# Patient Record
Sex: Female | Born: 1990 | Race: Black or African American | Hispanic: No | Marital: Single | Smoking: Never smoker
Health system: Southern US, Community
[De-identification: ages and names within clinical notes are randomized; demographics above are authoritative.]

## PROBLEM LIST (undated history)

## (undated) HISTORY — PX: WISDOM TOOTH EXTRACTION: SHX21

---

## 2012-05-10 ENCOUNTER — Encounter (HOSPITAL_COMMUNITY): Payer: Self-pay | Admitting: *Deleted

## 2012-05-10 ENCOUNTER — Other Ambulatory Visit (HOSPITAL_COMMUNITY)
Admission: RE | Admit: 2012-05-10 | Discharge: 2012-05-10 | Disposition: A | Discharge status: Home / Self Care | Payer: Self-pay | Source: Ambulatory Visit | Attending: Emergency Medicine | Admitting: Emergency Medicine

## 2012-05-10 ENCOUNTER — Emergency Department (INDEPENDENT_AMBULATORY_CARE_PROVIDER_SITE_OTHER)
Admission: EM | Admit: 2012-05-10 | Discharge: 2012-05-10 | Disposition: A | Discharge status: Home / Self Care | Payer: Self-pay | Source: Home / Self Care | Attending: Emergency Medicine | Admitting: Emergency Medicine

## 2012-05-10 DIAGNOSIS — N760 Acute vaginitis: Secondary | ICD-10-CM | POA: Insufficient documentation

## 2012-05-10 DIAGNOSIS — N949 Unspecified condition associated with female genital organs and menstrual cycle: Secondary | ICD-10-CM

## 2012-05-10 DIAGNOSIS — N938 Other specified abnormal uterine and vaginal bleeding: Secondary | ICD-10-CM

## 2012-05-10 LAB — POCT URINALYSIS DIP (DEVICE)
Bilirubin Urine: NEGATIVE
Ketones, ur: NEGATIVE mg/dL
Specific Gravity, Urine: 1.02 (ref 1.005–1.030)
pH: 7 (ref 5.0–8.0)

## 2012-05-10 NOTE — ED Notes (Signed)
Pt reports abnormal vaginal bleeding

## 2012-05-10 NOTE — ED Notes (Signed)
Lab order review  

## 2012-05-10 NOTE — ED Provider Notes (Signed)
Chief Complaint  Patient presents with  . Vaginal Bleeding    History of Present Illness:   The patient is a 21 year old female who's had a one-week history of light vaginal spotting. She has mild cramping rated a 1/10 in intensity. She denies any passage of clots or tissue. Last menstrual period was October 24 and lasted for 5 days. This was her normal menses. She is sexually active and usually uses condoms, however in the past month on a couple occasions she has not use them. She denies any fever, chills, nausea, vomiting, or urinary symptoms. She has had no prior history of GYN problems or STDs.  Review of Systems:  Other than noted above, the patient denies any of the following symptoms: Systemic:  No fever, chills, sweats, fatigue, or weight loss. GI:  No abdominal pain, nausea, anorexia, vomiting, diarrhea, constipation, melena or hematochezia. GU:  No dysuria, frequency, urgency, hematuria, vaginal discharge, itching, or abnormal vaginal bleeding. Skin:  No rash or itching.   PMFSH:  Past medical history, family history, social history, meds, and allergies were reviewed.  Physical Exam:   Vital signs:  BP 137/80  Pulse 88  Temp 99.4 F (37.4 C) (Oral)  Resp 18  SpO2 100% General:  Alert, oriented and in no distress. Lungs:  Breath sounds clear and equal bilaterally.  No wheezes, rales or rhonchi. Heart:  Regular rhythm.  No gallops or murmers. Abdomen:  Soft, flat and non-distended.  No organomegaly or mass.  No tenderness, guarding or rebound.  Bowel sounds normally active. Pelvic exam:  Normal external genitalia. Vaginal and cervical mucosa were normal. No cervical motion tenderness. There was no blood in the vaginal vault or coming from the cervical os. Uterus was midposition normal in size and shape and nontender. No adnexal masses or tenderness. Skin:  Clear, warm and dry.  Labs:   Results for orders placed during the hospital encounter of 05/10/12  POCT URINALYSIS DIP  (DEVICE)      Component Value Range   Glucose, UA NEGATIVE  NEGATIVE mg/dL   Bilirubin Urine NEGATIVE  NEGATIVE   Ketones, ur NEGATIVE  NEGATIVE mg/dL   Specific Gravity, Urine 1.020  1.005 - 1.030   Hgb urine dipstick TRACE (*) NEGATIVE   pH 7.0  5.0 - 8.0   Protein, ur 30 (*) NEGATIVE mg/dL   Urobilinogen, UA 1.0  0.0 - 1.0 mg/dL   Nitrite NEGATIVE  NEGATIVE   Leukocytes, UA TRACE (*) NEGATIVE  POCT PREGNANCY, URINE      Component Value Range   Preg Test, Ur NEGATIVE  NEGATIVE   Other Labs Obtained at Urgent Care Center:  GC and Chlamydia DNA probe was obtained as well as DNA probe for Trichomonas, Candida, and bacterial vaginosis.  Results are pending at this time and we will call about any positive results.  Assessment:  The encounter diagnosis was Dysfunctional uterine bleeding.  Plan:   1.  The following meds were prescribed:   New Prescriptions   No medications on file   2.  The patient was instructed in symptomatic care and handouts were given. 3.  The patient was told to return if becoming worse in any way, if no better in 3 or 4 days, and given some red flag symptoms that would indicate earlier return.    Reuben Likes, MD 05/10/12 [phone]

## 2012-05-14 ENCOUNTER — Telehealth (HOSPITAL_COMMUNITY): Payer: Self-pay | Admitting: Emergency Medicine

## 2012-05-14 MED ORDER — METRONIDAZOLE 500 MG PO TABS: 500.0000 mg | ORAL_TABLET | Freq: Two times a day (BID) | ORAL | Status: DC

## 2012-05-14 NOTE — ED Notes (Signed)
Her Gardnerella test came back positive, so we'll go ahead and treat with metronidazole 500 mg twice a day for a week.  Reuben Likes, MD 05/14/12 [phone]

## 2012-05-14 NOTE — Telephone Encounter (Signed)
Message copied by Reuben Likes on Sun May 14, 2012 11:51 AM ------      Message from: Vassie Moselle      Created: Fri May 12, 2012 11:10 PM       Gardnerella pos.  Do I need to do anything?  Vassie Moselle      05/12/2012

## 2012-05-15 ENCOUNTER — Telehealth (HOSPITAL_COMMUNITY): Payer: Self-pay | Admitting: *Deleted

## 2012-05-15 NOTE — ED Notes (Signed)
GC/Chlamydia neg., Wet prep: pos. for Gardnerella.  Dr. Lorenz Coaster ordered Flagyl 500 mg. BID x 7 days. I called pt.  Pt. verified x 2 and given results.  Pt. told she needs Flagyl for bacterial vaginosis.  Pt. instructed to no alcohol while taking this medication.  Pt. wants Rx. called to [facility] on [address]. Rx. called to pharmacist @ [phone].

## 2013-01-11 ENCOUNTER — Emergency Department (HOSPITAL_COMMUNITY)
Admission: EM | Admit: 2013-01-11 | Discharge: 2013-01-11 | Disposition: A | Discharge status: Home / Self Care | Payer: Self-pay | Attending: Emergency Medicine | Admitting: Emergency Medicine

## 2013-01-11 ENCOUNTER — Encounter (HOSPITAL_COMMUNITY): Payer: Self-pay | Admitting: Family Medicine

## 2013-01-11 ENCOUNTER — Emergency Department (HOSPITAL_COMMUNITY): Payer: Self-pay

## 2013-01-11 DIAGNOSIS — R0789 Other chest pain: Secondary | ICD-10-CM

## 2013-01-11 DIAGNOSIS — R071 Chest pain on breathing: Secondary | ICD-10-CM | POA: Insufficient documentation

## 2013-01-11 LAB — CBC
Hemoglobin: 10.8 g/dL — ABNORMAL LOW (ref 12.0–15.0)
MCH: 24.5 pg — ABNORMAL LOW (ref 26.0–34.0)
RBC: 4.4 MIL/uL (ref 3.87–5.11)

## 2013-01-11 LAB — BASIC METABOLIC PANEL
CO2: 27 mEq/L (ref 19–32)
Calcium: 9.5 mg/dL (ref 8.4–10.5)
Glucose, Bld: 102 mg/dL — ABNORMAL HIGH (ref 70–99)
Sodium: 139 mEq/L (ref 135–145)

## 2013-01-11 LAB — POCT I-STAT TROPONIN I

## 2013-01-11 MED ORDER — IBUPROFEN 600 MG PO TABS: 600.0000 mg | ORAL_TABLET | Freq: Three times a day (TID) | ORAL | Status: DC | PRN

## 2013-01-11 MED ORDER — GI COCKTAIL ~~LOC~~
30.0000 mL | Freq: Once | ORAL | Status: AC
  Administered 2013-01-11: 30 mL via ORAL
  Filled 2013-01-11: qty 30

## 2013-01-11 NOTE — ED Notes (Signed)
Per pt sts intermittent chest pain since Tuesday. sts it feels like heartburn. sts mid sternal and non-radiating. sts a deep breath makes her heart flutter.

## 2013-01-11 NOTE — ED Provider Notes (Signed)
I saw and evaluated the patient, reviewed the resident's note and I agree with the findings and plan. The patient presents with complaints of chest discomfort that started several days ago after bending over to pick up an object she had dropped.  She denies any shortness of breath, diaphoresis, or radiation to the arm or jaw.  She denies recent exertional symptoms.    On exam, the vitals are stable and the patient is afebrile.  The heart is regular rate and rhythm and the lungs are clear.  The abdomen is soft, non-tender.  The extremities are without edema.    The ekg is unremarkable (I agree with the resident's interpretation) and the troponin is negative.  This seems musculoskeletal in nature.  Will discharge with nsaids, return prn if she worsens.   Geoffery Lyons, MD 01/11/13 [phone]

## 2013-01-11 NOTE — ED Provider Notes (Signed)
CSN: [id number]     Arrival date & time 01/11/13  1546 History     First MD Initiated Contact with Patient 01/11/13 1826     Chief Complaint  Patient presents with  . Chest Pain   (Consider location/radiation/quality/duration/timing/severity/associated sxs/prior Treatment) HPI Comments: Pt w/ no PMHx now w/ chest pain. States moved awkwardly on Monday while at work (dropped radio and bent sideways to pick up), noted gradual onset substernal pressure after that incident. Pressure is constant, not exacerbated or relieved by anything. Not a/w dyspnea, nausea or diaphoresis. Not pleuritic or exertional. No recent fever or cough. No hx of DVT/PE, no tobacco or drugs, no hx of HTN, HLD or DM. + family hx of CAD.  Patient is a 22 y.o. female presenting with chest pain. The history is provided by the patient. No language interpreter was used.  Chest Pain Pain location:  Substernal area Pain quality: aching and pressure   Pain radiates to:  Does not radiate Pain radiates to the back: no   Pain severity:  Mild Onset quality:  Gradual Duration:  4 days Timing:  Constant Progression:  Unchanged Chronicity:  New Context: not breathing, not lifting, no movement, not at rest, no stress and no trauma   Relieved by:  Nothing Worsened by:  Nothing tried Ineffective treatments:  None tried Associated symptoms: no abdominal pain, no cough, no dizziness, no fever, no headache, no nausea, no shortness of breath and not vomiting     History reviewed. No pertinent past medical history. History reviewed. No pertinent past surgical history. History reviewed. No pertinent family history. History  Substance Use Topics  . Smoking status: Never Smoker   . Smokeless tobacco: Not on file  . Alcohol Use: No   OB History   Grav Para Term Preterm Abortions TAB SAB Ect Mult Living                 Review of Systems  Constitutional: Negative for fever and chills.  HENT: Negative for congestion and sore  throat.   Respiratory: Negative for cough and shortness of breath.   Cardiovascular: Positive for chest pain. Negative for leg swelling.  Gastrointestinal: Negative for nausea, vomiting, abdominal pain, diarrhea and constipation.  Genitourinary: Negative for dysuria and frequency.  Skin: Negative for color change and rash.  Neurological: Negative for dizziness and headaches.  Psychiatric/Behavioral: Negative for confusion and agitation.  All other systems reviewed and are negative.    Allergies  Review of patient's allergies indicates no known allergies.  Home Medications   Current Outpatient Rx  Name  Route  Sig  Dispense  Refill  . Ibuprofen (IBU PO)   Oral   Take 4 tablets by mouth daily as needed (pain).          BP 153/44  Pulse 86  Temp(Src) 98 F (36.7 C)  Resp 18  SpO2 97%  LMP 12/30/2012 Physical Exam  Vitals reviewed. Constitutional: She is oriented to person, place, and time. She appears well-developed and well-nourished. No distress.  HENT:  Head: Normocephalic and atraumatic.  Eyes: EOM are normal. Pupils are equal, round, and reactive to light.  Neck: Normal range of motion. Neck supple.  Cardiovascular: Normal rate and regular rhythm.   Pulses:      Radial pulses are 2+ on the right side, and 2+ on the left side.  Pulmonary/Chest: Effort normal and breath sounds normal. No respiratory distress.  Abdominal: Soft. She exhibits no distension.  Musculoskeletal: Normal range of motion.  She exhibits no edema.       Right lower leg: She exhibits no swelling and no edema.       Left lower leg: She exhibits no swelling and no edema.  Neurological: She is alert and oriented to person, place, and time.  Skin: Skin is warm and dry.  Psychiatric: She has a normal mood and affect. Her behavior is normal.    ED Course   Procedures (including critical care time)  Results for orders placed during the hospital encounter of 01/11/13  CBC      Result Value Range    WBC 8.4  4.0 - 10.5 K/uL   RBC 4.40  3.87 - 5.11 MIL/uL   Hemoglobin 10.8 (*) 12.0 - 15.0 g/dL   HCT 78.2 (*) 95.6 - 21.3 %   MCV 76.4 (*) 78.0 - 100.0 fL   MCH 24.5 (*) 26.0 - 34.0 pg   MCHC 32.1  30.0 - 36.0 g/dL   RDW 08.6  57.8 - 46.9 %   Platelets 318  150 - 400 K/uL  BASIC METABOLIC PANEL      Result Value Range   Sodium 139  135 - 145 mEq/L   Potassium 3.4 (*) 3.5 - 5.1 mEq/L   Chloride 102  96 - 112 mEq/L   CO2 27  19 - 32 mEq/L   Glucose, Bld 102 (*) 70 - 99 mg/dL   BUN 14  6 - 23 mg/dL   Creatinine, Ser 6.29  0.50 - 1.10 mg/dL   Calcium 9.5  8.4 - 52.8 mg/dL   GFR calc non Af Amer 82 (*) >90 mL/min   GFR calc Af Amer >90  >90 mL/min  POCT I-STAT TROPONIN I      Result Value Range   Troponin i, poc 0.01  0.00 - 0.08 ng/mL   Comment 3             Date: 01/11/2013  Rate: 89  Rhythm: normal sinus rhythm  QRS Axis: normal  Intervals: normal  ST/T Wave abnormalities: normal  Conduction Disutrbances:none  Narrative Interpretation:   Old EKG Reviewed: none available    Dg Chest 2 View  01/11/2013   *RADIOLOGY REPORT*  Clinical Data: Chest pain, pressure  CHEST - 2 VIEW  Comparison:  None.  Findings:  The heart size and mediastinal contours are within normal limits.  Both lungs are clear.  The visualized skeletal structures are unremarkable.  IMPRESSION: No active cardiopulmonary disease.   Original Report Authenticated By: Judie Petit. Miles Costain, M.D.   No diagnosis found.  MDM  Exam as above, NAD, vitals unremarkable, no hypoxia or tachypnea, no tachycardia, chest wall nttp, lungs clear. Heart RRR. CXR - NACPF, ECG w/out acute ischemia, troponin negative, labs otherwise unremarkable. Doubt ACS - low risk and atypical pain. Doubt PE (low risk wells and perc negative), doubt spont ptx, boerhaave, tamponade or dissection. Likely chest wall strain. Stable for d/c home. Given rx for motrin 600mg  TID x 5 days. fup w/ pcp as needed. Given return precautions. D/c home in good condition.    I have personally reviewed labs and imaging and considered in my MDM. Case d/w Dr Judd Lien  1. Chest wall pain    New Prescriptions   IBUPROFEN (ADVIL,MOTRIN) 600 MG TABLET    Take 1 tablet (600 mg total) by mouth every 8 (eight) hours as needed for pain.   [facility]     [address] [place] [address]  As needed if symptoms worsen    Audelia Hives, MD 01/11/13 [phone]

## 2013-12-01 ENCOUNTER — Emergency Department (HOSPITAL_COMMUNITY)
Admission: EM | Admit: 2013-12-01 | Discharge: 2013-12-01 | Disposition: A | Discharge status: Home / Self Care | Payer: Self-pay | Attending: Emergency Medicine | Admitting: Emergency Medicine

## 2013-12-01 ENCOUNTER — Encounter (HOSPITAL_COMMUNITY): Payer: Self-pay | Admitting: Emergency Medicine

## 2013-12-01 DIAGNOSIS — H11422 Conjunctival edema, left eye: Secondary | ICD-10-CM

## 2013-12-01 DIAGNOSIS — H11429 Conjunctival edema, unspecified eye: Secondary | ICD-10-CM | POA: Insufficient documentation

## 2013-12-01 MED ORDER — TETRACAINE HCL 0.5 % OP SOLN
1.0000 [drp] | Freq: Once | OPHTHALMIC | Status: AC
  Administered 2013-12-01: 1 [drp] via OPHTHALMIC
  Filled 2013-12-01: qty 2

## 2013-12-01 MED ORDER — FLUORESCEIN SODIUM 1 MG OP STRP
1.0000 | ORAL_STRIP | Freq: Once | OPHTHALMIC | Status: AC
  Administered 2013-12-01: 1 via OPHTHALMIC
  Filled 2013-12-01: qty 1

## 2013-12-01 MED ORDER — ERYTHROMYCIN 5 MG/GM OP OINT: 1.0000 "application " | TOPICAL_OINTMENT | Freq: Four times a day (QID) | OPHTHALMIC | Status: DC

## 2013-12-01 NOTE — ED Notes (Signed)
Eye Equipment at the bedside.

## 2013-12-01 NOTE — Discharge Instructions (Signed)
Call for a follow up appointment with a Family or Primary Care Provider.  Call for an appointment with an ophthalmologist or another eye specialist for further evaluation of your eye irritation.  Return if Symptoms worsen.   Take medication as prescribed.  Do not scratch the eye is a warm compress as needed.

## 2013-12-01 NOTE — ED Provider Notes (Signed)
CSN: [id number]     Arrival date & time 12/01/13  1959 History  This chart was scribed for non-physician practitioner working with No att. providers found by Elveria Risingimelie Horne, ED Scribe. This patient was seen in room TR04C/TR04C and the patient's care was started at 8:08 PM.   Chief Complaint  Patient presents with  . Eye Pain     HPI Comments: Mary Atkins is a 23 y.o. female with history of seasonal allergies who presents to the Emergency Department complaining of left eye itching and redness since this morning. Patient reports burning pain after rubbing her left eye. Reports watery discharge from the eye after rubbing. Patient reports burning pain with movement of the eye. Patient denies presence of foreign body, chills, visual changes, and headache. Patient does not wear corrective lens. Patient's last eye exam was years ago.  Patient denies history of HTN.   The history is provided by the patient. No language interpreter was used.    No past medical history on file. No past surgical history on file. No family history on file. History  Substance Use Topics  . Smoking status: Never Smoker   . Smokeless tobacco: Not on file  . Alcohol Use: No   OB History   Grav Para Term Preterm Abortions TAB SAB Ect Mult Living                 Review of Systems  Constitutional: Negative for fever and diaphoresis.  Eyes: Positive for pain, redness and itching. Negative for photophobia, discharge and visual disturbance.  Gastrointestinal: Negative for nausea, vomiting and diarrhea.  Neurological: Negative for weakness, light-headedness, numbness and headaches.      Allergies  Review of patient's allergies indicates no known allergies.  Home Medications   Prior to Admission medications   Medication Sig Start Date End Date Taking? Authorizing Provider  ibuprofen (ADVIL,MOTRIN) 600 MG tablet Take 1 tablet (600 mg total) by mouth every 8 (eight) hours as needed for pain. 01/11/13   Audelia Hivesharles  Hundley, MD  Ibuprofen (IBU PO) Take 4 tablets by mouth daily as needed (pain).    Historical Provider, MD   There were no vitals taken for this visit. Physical Exam  Nursing note and vitals reviewed. Constitutional: She is oriented to person, place, and time. She appears well-developed and well-nourished.  Non-toxic appearance. She does not have a sickly appearance. She does not appear ill. No distress.  HENT:  Head: Normocephalic and atraumatic.  Eyes: EOM are normal. Pupils are equal, round, and reactive to light. Left eye exhibits chemosis and discharge. Right conjunctiva is injected.  Slit lamp exam:      The left eye shows no corneal abrasion, no corneal flare and no fluorescein uptake.  IOP: Left: 24, 22  22, 22  Right: 21, 22,  19, 22  No dendritic lesions.  Neck: Normal range of motion. Neck supple.  Pulmonary/Chest: Effort normal. No respiratory distress.  Musculoskeletal: Normal range of motion.  Neurological: She is alert and oriented to person, place, and time.  Skin: Skin is warm and dry. She is not diaphoretic.  Psychiatric: She has a normal mood and affect. Her behavior is normal.    ED Course  Procedures (including critical care time) COORDINATION OF CARE: 8:09 PM- Discussed treatment plan with patient at bedside and patient agreed to plan.   Labs Review Labs Reviewed - No data to display  Imaging Review No results found.   EKG Interpretation None      MDM  Final diagnoses:  Chemosis of left conjunctiva   Patient presents with left eye chemosis, no fluro uptake or FB seen.  Pt has slightly elevated IOP.  Discussed with Dr. Silverio LayYao who agrees erythromycin and treated as an out patient. Discussed treatment plan with the patient. Return precautions given. Reports understanding and no other concerns at this time.  Patient is stable for discharge at this time.   Meds given in ED:  Medications  tetracaine (PONTOCAINE) 0.5 % ophthalmic solution 1 drop (not  administered)  fluorescein ophthalmic strip 1 strip (not administered)    Discharge Medication List as of 12/01/2013  9:27 PM    START taking these medications   Details  erythromycin ophthalmic ointment Place 1 application into the left eye every 6 (six) hours. Place 1/2 inch ribbon of ointment in the affected eye 4 times a day, Starting 12/01/2013, Until Discontinued, Print         I personally performed the services described in this documentation, which was scribed in my presence. The recorded information has been reviewed and is accurate.    Clabe SealLauren M Reanna Scoggin, PA-C 12/02/13 [phone]

## 2013-12-01 NOTE — ED Notes (Signed)
PA at the bedside.

## 2013-12-01 NOTE — ED Notes (Signed)
Patient refused wheelchair. 

## 2013-12-01 NOTE — ED Notes (Signed)
Patient has swelling noted around the left eye. Patient has redness and itching as well. Patient has no visual changes.

## 2013-12-01 NOTE — ED Notes (Signed)
The pt has had a red itching lt eye since this am.  She has allergies

## 2013-12-06 NOTE — ED Provider Notes (Signed)
Medical screening examination/treatment/procedure(s) were performed by non-physician practitioner and as supervising physician I was immediately available for consultation/collaboration.   EKG Interpretation None        Abagayle Klutts H Murlene Revell, MD 12/06/13 0659 

## 2014-09-16 ENCOUNTER — Encounter (HOSPITAL_COMMUNITY): Payer: Self-pay | Admitting: *Deleted

## 2014-09-16 ENCOUNTER — Emergency Department (HOSPITAL_COMMUNITY)
Admission: EM | Admit: 2014-09-16 | Discharge: 2014-09-16 | Disposition: A | Discharge status: Home / Self Care | Payer: Self-pay | Attending: Emergency Medicine | Admitting: Emergency Medicine

## 2014-09-16 DIAGNOSIS — K088 Other specified disorders of teeth and supporting structures: Secondary | ICD-10-CM | POA: Insufficient documentation

## 2014-09-16 DIAGNOSIS — K0889 Other specified disorders of teeth and supporting structures: Secondary | ICD-10-CM

## 2014-09-16 DIAGNOSIS — Z792 Long term (current) use of antibiotics: Secondary | ICD-10-CM | POA: Insufficient documentation

## 2014-09-16 MED ORDER — IBUPROFEN 800 MG PO TABS: 800.0000 mg | ORAL_TABLET | Freq: Three times a day (TID) | ORAL | Status: DC

## 2014-09-16 MED ORDER — PENICILLIN V POTASSIUM 500 MG PO TABS: 500.0000 mg | ORAL_TABLET | Freq: Four times a day (QID) | ORAL | Status: AC

## 2014-09-16 NOTE — Discharge Instructions (Signed)
Dental Pain °A tooth ache may be caused by cavities (tooth decay). Cavities expose the nerve of the tooth to air and hot or cold temperatures. It may come from an infection or abscess (also called a boil or furuncle) around your tooth. It is also often caused by dental caries (tooth decay). This causes the pain you are having. °DIAGNOSIS  °Your caregiver can diagnose this problem by exam. °TREATMENT  °· If caused by an infection, it may be treated with medications which kill germs (antibiotics) and pain medications as prescribed by your caregiver. Take medications as directed. °· Only take over-the-counter or prescription medicines for pain, discomfort, or fever as directed by your caregiver. °· Whether the tooth ache today is caused by infection or dental disease, you should see your dentist as soon as possible for further care. °SEEK MEDICAL CARE IF: °The exam and treatment you received today has been provided on an emergency basis only. This is not a substitute for complete medical or dental care. If your problem worsens or new problems (symptoms) appear, and you are unable to meet with your dentist, call or return to this location. °SEEK IMMEDIATE MEDICAL CARE IF:  °· You have a fever. °· You develop redness and swelling of your face, jaw, or neck. °· You are unable to open your mouth. °· You have severe pain uncontrolled by pain medicine. °MAKE SURE YOU:  °· Understand these instructions. °· Will watch your condition. °· Will get help right away if you are not doing well or get worse. °Document Released: 06/07/2005 Document Revised: 08/30/2011 Document Reviewed: 01/24/2008 °ExitCare® Patient Information ©2015 [employer]. This information is not intended to replace advice given to you by your health care provider. Make sure you discuss any questions you have with your health care provider. ° °Dental Care and Dentist Visits °Dental care supports good overall health. Regular dental visits can also help you  avoid dental pain, bleeding, infection, and other more serious health problems in the future. It is important to keep the mouth healthy because diseases in the teeth, gums, and other oral tissues can spread to other areas of the body. Some problems, such as diabetes, heart disease, and pre-term labor have been associated with poor oral health.  °See your dentist every 6 months. If you experience emergency problems such as a toothache or broken tooth, go to the dentist right away. If you see your dentist regularly, you may catch problems early. It is easier to be treated for problems in the early stages.  °WHAT TO EXPECT AT A DENTIST VISIT  °Your dentist will look for many common oral health problems and recommend proper treatment. At your regular dental visit, you can expect: °· Gentle cleaning of the teeth and gums. This includes scraping and polishing. This helps to remove the sticky substance around the teeth and gums (plaque). Plaque forms in the mouth shortly after eating. Over time, plaque hardens on the teeth as tartar. If tartar is not removed regularly, it can cause problems. Cleaning also helps remove stains. °· Periodic X-rays. These pictures of the teeth and supporting bone will help your dentist assess the health of your teeth. °· Periodic fluoride treatments. Fluoride is a natural mineral shown to help strengthen teeth. Fluoride treatment involves applying a fluoride gel or varnish to the teeth. It is most commonly done in children. °· Examination of the mouth, tongue, jaws, teeth, and gums to look for any oral health problems, such as: °¨ Cavities (dental caries). This is   decay on the tooth caused by plaque, sugar, and acid in the mouth. It is best to catch a cavity when it is small.  Inflammation of the gums caused by plaque buildup (gingivitis).  Problems with the mouth or malformed or misaligned teeth.  Oral cancer or other diseases of the soft tissues or jaws. KEEP YOUR TEETH AND GUMS  HEALTHY For healthy teeth and gums, follow these general guidelines as well as your dentist's specific advice:  Have your teeth professionally cleaned at the dentist every 6 months.  Brush twice daily with a fluoride toothpaste.  Floss your teeth daily.  Ask your dentist if you need fluoride supplements, treatments, or fluoride toothpaste.  Eat a healthy diet. Reduce foods and drinks with added sugar.  Avoid smoking. TREATMENT FOR ORAL HEALTH PROBLEMS If you have oral health problems, treatment varies depending on the conditions present in your teeth and gums.  Your caregiver will most likely recommend good oral hygiene at each visit.  For cavities, gingivitis, or other oral health disease, your caregiver will perform a procedure to treat the problem. This is typically done at a separate appointment. Sometimes your caregiver will refer you to another dental specialist for specific tooth problems or for surgery. SEEK IMMEDIATE DENTAL CARE IF:  You have pain, bleeding, or soreness in the gum, tooth, jaw, or mouth area.  A permanent tooth becomes loose or separated from the gum socket.  You experience a blow or injury to the mouth or jaw area. Document Released: 02/17/2011 Document Revised: 08/30/2011 Document Reviewed: 02/17/2011 [facility] Patient Information 2015 [place], [place]. This information is not intended to replace advice given to you by your health care provider. Make sure you discuss any questions you have with your health care provider.   Please take your antibiotics as directed. Please take your ibuprofen as directed and do not take it with other NSAIDs like Advil, Motrin, Aleve.

## 2014-09-16 NOTE — ED Notes (Signed)
Pt refuses dental block for pain .

## 2014-09-16 NOTE — ED Notes (Signed)
Pt reports pain to Rt lower tooth . Pt is unsure if tooth is broken. Pt has called dentist and unable to make an appt until May.

## 2014-09-16 NOTE — ED Provider Notes (Signed)
CSN: [id number]     Arrival date & time 09/16/14  4098 History   First MD Initiated Contact with Patient 09/16/14 [phone]     Chief Complaint  Patient presents with  . Dental Pain     (Consider location/radiation/quality/duration/timing/severity/associated sxs/prior Treatment) HPI Mary Atkins is a 24 y.o. female who comes in for evaluation of right lower dental pain for the past week. Pain is been intermittent and gradual. Pain is characterized as a throbbing sensation, at its worst and 8/10. She has tried ibuprofen without relief and Orajel exacerbates her pain. She reports having recently switched insurance through her work and the dentist they recommended is not accepting patients until May. She denies any fevers, difficulties breathing, trouble swallowing  History reviewed. No pertinent past medical history. History reviewed. No pertinent past surgical history. History reviewed. No pertinent family history. History  Substance Use Topics  . Smoking status: Never Smoker   . Smokeless tobacco: Not on file  . Alcohol Use: No   OB History    No data available     Review of Systems  Constitutional: Negative for fever.  HENT: Positive for dental problem. Negative for drooling, facial swelling, rhinorrhea, sinus pressure and trouble swallowing.   Respiratory: Negative for shortness of breath.   Cardiovascular: Negative for chest pain.  Skin: Negative for rash.      Allergies  Review of patient's allergies indicates no known allergies.  Home Medications   Prior to Admission medications   Medication Sig Start Date End Date Taking? Authorizing Provider  erythromycin ophthalmic ointment Place 1 application into the left eye every 6 (six) hours. Place 1/2 inch ribbon of ointment in the affected eye 4 times a day 12/01/13   Mellody Drown, PA-C  ibuprofen (ADVIL,MOTRIN) 800 MG tablet Take 1 tablet (800 mg total) by mouth 3 (three) times daily. 09/16/14   Joycie Peek, PA-C   penicillin v potassium (VEETID) 500 MG tablet Take 1 tablet (500 mg total) by mouth 4 (four) times daily. 09/16/14 09/23/14  Joycie Peek, PA-C   BP 146/100 mmHg  Pulse 77  Temp(Src) 98.4 F (36.9 C) (Oral)  Resp 18  Ht  (1.6 m)  Wt 275 lb (124.739 kg)  BMI 48.73 kg/m2  SpO2 100%  LMP 09/16/2014 Physical Exam  Constitutional:  Awake, alert, nontoxic appearance.  HENT:  Head: Atraumatic.  Discomfort located to right lower posterior molar.  Mucous membranes are moist. No obvious broken teeth. No unilateral tonsillar swelling, uvula midline, no glossal swelling or elevation. No trismus. No fluctuance or evidence of a drainable abscess. No other evidence of emergent infection, Retropharyngeal or Peritonsillar abscess, Ludwig or Vincents angina. Tolerating secretions well. Patent airway   Eyes: Right eye exhibits no discharge. Left eye exhibits no discharge.  Neck: Normal range of motion. Neck supple. No tracheal deviation present.  Pulmonary/Chest: Effort normal. She exhibits no tenderness.  Abdominal: Soft. There is no tenderness. There is no rebound.  Musculoskeletal: She exhibits no tenderness.  Baseline ROM, no obvious new focal weakness.  Lymphadenopathy:    She has no cervical adenopathy.  Neurological:  Mental status and motor strength appears baseline for patient and situation.  Skin: No rash noted.  Psychiatric: She has a normal mood and affect.  Nursing note and vitals reviewed.   ED Course  Procedures (including critical care time)   Labs Review Labs Reviewed - No data to display  Imaging Review No results found.   EKG Interpretation None     Filed  Vitals:   09/16/14 0744  BP: 146/100  Pulse: 77  Temp: 98.4 F (36.9 C)  TempSrc: Oral  Resp: 18  Height: 5\' 3"  (1.6 m)  Weight: 275 lb (124.739 kg)  SpO2: 100%   Meds given in ED:  Medications - No data to display  Discharge Medication List as of 09/16/2014  8:07 AM    START taking these  medications   Details  penicillin v potassium (VEETID) 500 MG tablet Take 1 tablet (500 mg total) by mouth 4 (four) times daily., Starting 09/16/2014, Until Mon 09/23/14, Print        MDM  Vitals stable -afebrile Pt resting comfortably in ED. PE--overall benign oral exam. Phonation is baseline. No evidence of peritonsillar or retropharyngeal abscess. No evidence of Ludwig or vincents angina. Patent airway and patient is tolerating secretions well  Patient refuses oral nerve block. Will DC with antibiotics and NSAIDs. Also given referral to dentistry. Encouraged f/u with Dentistry sooner for formal dental exam and re-evaluation.  I discussed all relevant lab findings and imaging results with pt and they verbalized understanding. Discussed f/u with PCP within 48 hrs and return precautions, pt very amenable to plan. Patient stable, in good condition and is appropriate for discharge.  Final diagnoses:  Pain, dental        Joycie PeekBenjamin Joylyn Duggin, PA-C 09/16/14 [id number]  Zadie Rhineonald Wickline, MD 09/16/14 [phone]

## 2014-09-16 NOTE — ED Notes (Signed)
Declined W/C at D/C and was escorted to lobby by RN. 

## 2015-04-24 ENCOUNTER — Encounter (HOSPITAL_COMMUNITY): Payer: Self-pay | Admitting: *Deleted

## 2015-04-24 ENCOUNTER — Emergency Department (HOSPITAL_COMMUNITY)
Admission: EM | Admit: 2015-04-24 | Discharge: 2015-04-24 | Disposition: A | Discharge status: Home / Self Care | Payer: Self-pay | Attending: Emergency Medicine | Admitting: Emergency Medicine

## 2015-04-24 DIAGNOSIS — J069 Acute upper respiratory infection, unspecified: Secondary | ICD-10-CM | POA: Insufficient documentation

## 2015-04-24 DIAGNOSIS — H6691 Otitis media, unspecified, right ear: Secondary | ICD-10-CM

## 2015-04-24 DIAGNOSIS — B9789 Other viral agents as the cause of diseases classified elsewhere: Secondary | ICD-10-CM

## 2015-04-24 DIAGNOSIS — H6591 Unspecified nonsuppurative otitis media, right ear: Secondary | ICD-10-CM | POA: Insufficient documentation

## 2015-04-24 MED ORDER — ACETAMINOPHEN 500 MG PO TABS
1000.0000 mg | ORAL_TABLET | Freq: Once | ORAL | Status: AC
  Administered 2015-04-24: 1000 mg via ORAL
  Filled 2015-04-24: qty 2

## 2015-04-24 MED ORDER — OXYMETAZOLINE HCL 0.05 % NA SOLN: 1.0000 | Freq: Two times a day (BID) | NASAL | Status: DC

## 2015-04-24 MED ORDER — BENZONATATE 100 MG PO CAPS: 200.0000 mg | ORAL_CAPSULE | Freq: Two times a day (BID) | ORAL | Status: DC | PRN

## 2015-04-24 MED ORDER — AMOXICILLIN 500 MG PO CAPS: 500.0000 mg | ORAL_CAPSULE | Freq: Two times a day (BID) | ORAL | Status: AC

## 2015-04-24 NOTE — Discharge Instructions (Signed)
Take your medications as prescribed. Please follow up with a primary care provider from the Resource Guide provided below in 1 week. Please return to the Emergency Department if symptoms worsen or new onset of fever, ear drainage, difficulty breathing, wheezing.   Emergency Department Resource Guide 1) Find a Doctor and Pay Out of Pocket Although you won't have to find out who is covered by your insurance plan, it is a good idea to ask around and get recommendations. You will then need to call the office and see if the doctor you have chosen will accept you as a new patient and what types of options they offer for patients who are self-pay. Some doctors offer discounts or will set up payment plans for their patients who do not have insurance, but you will need to ask so you aren't surprised when you get to your appointment.  2) Contact Your Local Health Department Not all health departments have doctors that can see patients for sick visits, but many do, so it is worth a call to see if yours does. If you don't know where your local health department is, you can check in your phone book. The CDC also has a tool to help you locate your state's health department, and many state websites also have listings of all of their local health departments.  3) Find a Walk-in Clinic If your illness is not likely to be very severe or complicated, you may want to try a walk in clinic. These are popping up all over the country in pharmacies, drugstores, and shopping centers. They're usually staffed by nurse practitioners or physician assistants that have been trained to treat common illnesses and complaints. They're usually fairly quick and inexpensive. However, if you have serious medical issues or chronic medical problems, these are probably not your best option.  No Primary Care Doctor: - Call Health Connect at  [phone] - they can help you locate a primary care doctor that  accepts your insurance, provides  certain services, etc. - Physician Referral Service- [phone]  Chronic Pain Problems: Organization         Address  Phone   Notes  [facility]  [phone] Patients need to be referred by their primary care doctor.   Medication Assistance: Organization         Address  Phone   Notes  [facility] [address] [place], [place] [address] [phone] --Must be a resident of [facility] -- Must have NO insurance coverage whatsoever (no Medicaid/ Medicare, etc.) -- The pt. MUST have a primary care doctor that directs their care regularly and follows them in the community   MedAssist  [phone]   [employer]  [phone]    Agencies that provide inexpensive medical care: Organization         Address  Phone   Notes  [facility]  [phone]   [facility]    [phone]   [facility] [address] [place], [place] [address] [phone]   [facility] [address] [place], [place] [phone]) [phone]   [facility]    [phone]   [facility]    [phone]   [facility]  [address], [place] Phone:  [phone], Fax:  [phone] Hours of Operation:  9 am - 6 pm, M-F.  Also accepts Medicaid/Medicare  and self-pay.  [facility] for [place], [address], [place] Phone: [phone], Fax: [phone]. Hours of Operation:  8:30 am - 5:30 pm, M-F.  Also accepts Medicaid and self-pay.  [facility] [place] [address] [place] Phone: [phone]   [place], [place], [place] [phone], Ext. [phone] Mondays & Thursdays: 7-9 AM.  First 15 patients are seen on a first come, first serve basis.    [place] Providers:  Organization         Address  Phone   Notes  [facility] [address], [place] [phone] Also accepts self-pay patients.  [facility] [address], [place]  [phone]   [place], [address], [place] [phone]   [facility] [address] [place] [phone]   Lucianne Lei [address], [place]   [phone] Only accepts [place] Access [place] patients after they have their name applied to their card.   Self-Pay (no insurance) in [facility]:  Organization         Address  Phone   Notes  Sickle Cell Patients, [facility] [place] [phone]   [facility] Care [place] [phone]   [facility] [place]  [place], [place], [place] [phone]   [facility]/Dr. Osei-Bonsu  [address] [place] or [address], [place] [phone] Phone number for both [place] and [place] locations is the same.  Urgent Medical and [facility] [address] [place] [phone]   [facility] [address], [place] or [address] [phone]   [facility] [address] [place] [phone], phone; [phone], fax Sees patients 1st and 3rd Saturday of every month.  Must not qualify for public or private insurance (i.e. Medicaid, Medicare, [place] Health Choice, Veterans' Benefits)  Household income should be no more than 200% of the poverty level The clinic cannot treat you if you are pregnant or think you are pregnant  Sexually transmitted diseases are not treated at the clinic.    Dental Care: Organization         Address  Phone  Notes  [facility] Department of [facility] [place] [phone]  Accepts children up to age 68 who are enrolled in [place] or [place]; pregnant women with a Medicaid card; and children who have applied for Medicaid or [place] Health Choice, but were declined, whose parents can pay a reduced fee at time of service.  [facility] Department of [facility]  [address], [place] [phone] Accepts children up to age 43 who are enrolled in [place] or [place]; pregnant women with a Medicaid card; and children who have applied for Medicaid or [place] Health Choice, but were declined, whose parents can pay a reduced fee at time of service.  [place] Adult [facility] Access PROGRAM  [place] [phone] Patients are seen by appointment only. Walk-ins are not accepted. [address] will see patients 63 years of age and older. Monday - Tuesday (8am-5pm) Most Wednesdays (  8:30-5pm) $30 per visit, cash only  [facility] Adult Dental Access PROGRAM  [address], [facility] [phone] Patients are seen by appointment only. Walk-ins are not accepted. [place] will see patients 74 years of age and older. One Wednesday Evening (Monthly: Volunteer Based).  $30 per visit, cash only  [place]  [phone] for adults; Children under age 14, call Graduate Pediatric Dentistry at [phone]. Children aged 69-14, please call [phone] to request a pediatric application.  Dental services are provided in all areas of dental care including fillings, crowns and bridges, complete and partial dentures, implants, gum treatment, root canals, and extractions. Preventive care is also provided. Treatment is provided to both adults and children. Patients are selected via a lottery and there is often a waiting list.   [facility] [address], [place]  [phone] [email or web address]   [facility] [address] [place], [place] [phone], Ext. [phone] Second  and Fourth Thursday of each month, opens at 6:30 AM; Clinic ends at 9 AM.  Patients are seen on a first-come first-served basis, and a limited number are seen during each clinic.   [facility]  [address] [place]ard [place], [place] [phone]   Eligibility Requirements You must have lived in [place], [place], or [place] counties for at least the last three months.   You cannot be eligible for state or federal sponsored [employer], including [employer], [place], or [employer].   You generally cannot be eligible for healthcare insurance through your employer.    How to apply: Eligibility screenings are held every Tuesday and Wednesday afternoon from 1:00 pm until 4:00 pm. You do not need an appointment for the interview!  [facility] [address], [place], [place]   [place]  [place] Department  [place]  [phone]    Behavioral Health Resources in the Community: Intensive Outpatient Programs Organization         Address  Phone  Notes  [place]. [address] [place], [place] [phone]   [facility] [address] [place], [place]   ADS: Alcohol & Drug Svcs [address] [place], [place], [place]   [place] [address]  [place], [place] or [phone]   Substance Abuse Resources Organization         Address  Phone  Notes  Alcohol and Drug Services  [phone]   [place]  [phone]   The [place]   Chinita Pester  [phone]   Residential & Outpatient Substance Abuse Program  [phone]   Psychological Services Organization         Address  Phone  Notes  [facility] [place]  [place]  [phone]   [place] [address] [place] or [phone]    Mobile Crisis Teams Organization         Address  Phone  Notes  [facility], Mobile Crisis Care Unit  [phone]   [facility]  [address] [place], [place]   [place] Levels [address], [place] [phone]    Self-Help/Support Groups Organization         Address  Phone  Notes  [facility]ental [facility] of [place] - variety of support groups  [place] Call for more information  Narcotics Anonymous (NA), Caring Services [address], [employer] [place]  2 meetings at this location   Special educational needs teacher         Address  Phone  Notes  ASAP Residential Treatment [place],    [place]  [phone]   [facility]  [address], [place] [phone], [place], [place]   [place], [place] [phone] Admissions: 8am-3pm M-F  Incentives Substance [place] [address]    [place], [place] [phone]   [facility] [address] [place], [place], [place]   [facility] [address],  [place], [place]   [facility] - Intensive Outpatient [address], [place], [place]   [facility] ([place].,  [place], [place] [phone] or [phone]   [facility] Services (RTS) [address], [place], [place] Accepts Medicaid  [facility] [place] [address]  [place] [phone] Substance Abuse/Addiction Treatment   [facility] Organization         Address  Phone  Notes  [facility]  [phone]   Domenic Schwab, PhD [address] [place]lis [place], [place]   [phone] or [phone]   [place], [place]  [phone]   [facility] [address] [place], [place] [phone] Insurance/Medicaid/sponsorship through [facility] and Families [address] [place]                                    [place], [place] [phone] [place] [address] [place], [place] [phone]    Dr. Adele Schilder  [phone]   [facility] Dept. 1) [address], [place] 2) [place] 3)  [place] [address], [place] [phone]  [phone]   [place] [phone] or [phone] (After Hours)

## 2015-04-24 NOTE — ED Notes (Signed)
PT reports RT ear pain started 2 days ago . Pt also reports sore throat and cough.

## 2015-04-24 NOTE — ED Notes (Signed)
Declined W/C at D/C and was escorted to lobby by RN. 

## 2015-04-24 NOTE — ED Provider Notes (Signed)
CSN: [id number]     Arrival date & time 04/24/15  [id number] History  By signing my name below, I, Emmanuella Mensah, attest that this documentation has been prepared under the direction and in the presence of Melburn HakeNicole Nthony Lefferts, [place]-C. Electronically Signed: Angelene GiovanniEmmanuella Mensah, ED Scribe. 04/24/2015. 11:41 AM.    Chief Complaint  Patient presents with  . Otalgia  . Sore Throat   The history is provided by the patient. No language interpreter was used.   HPI Comments: Mary Atkins is a 24 y.o. female who presents to the Emergency Department complaining of a gradually worsening constant pressure right ear pain onset 2 days ago. She reports associated low grade fever and sore throat onset 4 days ago. She also reports mild rhinorrhea, productive cough with yellow sputum, and sinus pressure since onset. She denies any drainage, generalized body aches, congestion, wheezing, chest pain, or abdominal pain. She states that she used hydrogen peroxide with short term relief. She denies taking any medication for her other symptoms.   History reviewed. No pertinent past medical history. History reviewed. No pertinent past surgical history. History reviewed. No pertinent family history. Social History  Substance Use Topics  . Smoking status: Never Smoker   . Smokeless tobacco: None  . Alcohol Use: No   OB History    No data available     Review of Systems  Constitutional: Positive for fever (low grade).  HENT: Positive for ear pain (right ear), rhinorrhea, sinus pressure and sore throat. Negative for congestion.   Respiratory: Positive for cough. Negative for wheezing.   Cardiovascular: Negative for chest pain.  Gastrointestinal: Negative for abdominal pain.  Musculoskeletal: Negative for myalgias.      Allergies  Review of patient's allergies indicates no known allergies.  Home Medications   Prior to Admission medications   Medication Sig Start Date End Date Taking? Authorizing Provider   erythromycin ophthalmic ointment Place 1 application into the left eye every 6 (six) hours. Place 1/2 inch ribbon of ointment in the affected eye 4 times a day 12/01/13   Mellody DrownLauren Parker, PA-C  ibuprofen (ADVIL,MOTRIN) 800 MG tablet Take 1 tablet (800 mg total) by mouth 3 (three) times daily. 09/16/14   Joycie PeekBenjamin Cartner, PA-C   BP 137/89 mmHg  Pulse 89  Temp(Src) 100.1 F (37.8 C) (Oral)  Resp 16  SpO2 100%  LMP 01/30/2015 Physical Exam  Constitutional: She is oriented to person, place, and time. She appears well-developed and well-nourished. No distress.  HENT:  Head: Normocephalic and atraumatic.  Right Ear: Hearing and external ear normal. No drainage, swelling or tenderness. No mastoid tenderness. Tympanic membrane is injected. A middle ear effusion is present.  Left Ear: Hearing, tympanic membrane and external ear normal.  Nose: Rhinorrhea present. Right sinus exhibits no maxillary sinus tenderness and no frontal sinus tenderness. Left sinus exhibits no maxillary sinus tenderness and no frontal sinus tenderness.  Mouth/Throat: Uvula is midline, oropharynx is clear and moist and mucous membranes are normal. No oropharyngeal exudate, posterior oropharyngeal edema, posterior oropharyngeal erythema or tonsillar abscesses.  Eyes: Conjunctivae and EOM are normal.  Neck: Neck supple. No tracheal deviation present.  Cardiovascular: Normal rate, regular rhythm and normal heart sounds.  Exam reveals no friction rub.   Pulmonary/Chest: Effort normal. No respiratory distress.  Musculoskeletal: Normal range of motion.  Lymphadenopathy:    She has no cervical adenopathy.  Neurological: She is alert and oriented to person, place, and time.  Skin: Skin is warm and dry.  Psychiatric: She has  a normal mood and affect. Her behavior is normal.  Nursing note and vitals reviewed.   ED Course  Procedures (including critical care time) DIAGNOSTIC STUDIES: Oxygen Saturation is 100% on RA, normal by my  interpretation.    COORDINATION OF CARE: 11:16 AM- Pt advised of plan for treatment and pt agrees. Upon examination, pt told she has a middle ear infection. Will receive a decongestant for her post nasal drip and cough medicine. Pt will also be placed on oral antibiotic.    Filed Vitals:   04/24/15 1138  BP: 147/63  Pulse: 78  Temp: 99.3 F (37.4 C)  Resp: 16     MDM   Final diagnoses:  Acute right otitis media, recurrence not specified, unspecified otitis media type  Viral URI with cough   Pt presents with right ear pain, rhinorrhea, cough, sinus pressure, subjective fever and sore throat. VSS, afebrile. Exam revealed erythematous right TM with effusion and rhinorrhea, remaining HEENT exam unremarkable, lungs CTAB. Presentation consistent with AOM with effusion and viral URI. Plan to d/c pt home with antibiotics, decongestant and antitussive.   Evaluation does not show pathology requring ongoing emergent intervention or admission. Pt is hemodynamically stable and mentating appropriately. Discussed findings/results and plan with patient/guardian, who agrees with plan. All questions answered. Return precautions discussed and outpatient follow up given.    I personally performed the services described in this documentation, which was scribed in my presence. The recorded information has been reviewed and is accurate.   Satira Sark [place], [place] 04/24/15 1952  Glynn Octave, MD 04/24/15 2017

## 2015-04-30 ENCOUNTER — Encounter (HOSPITAL_COMMUNITY): Payer: Self-pay | Admitting: Emergency Medicine

## 2015-04-30 ENCOUNTER — Emergency Department (HOSPITAL_COMMUNITY)
Admission: EM | Admit: 2015-04-30 | Discharge: 2015-04-30 | Disposition: A | Discharge status: Home / Self Care | Payer: Self-pay | Attending: Physician Assistant | Admitting: Physician Assistant

## 2015-04-30 DIAGNOSIS — H6691 Otitis media, unspecified, right ear: Secondary | ICD-10-CM | POA: Insufficient documentation

## 2015-04-30 NOTE — ED Provider Notes (Signed)
CSN: [id number]     Arrival date & time 04/30/15  0744 History   First MD Initiated Contact with Patient 04/30/15 0759     Chief Complaint  Patient presents with  . Otalgia     (Consider location/radiation/quality/duration/timing/severity/associated sxs/prior Treatment) HPI  Mary Atkins is a 24 y.o. female with no significant PMH who was seen 04/24/15 for AOM with effusion and URI.  She reports that her "hearing is muffled", and has not gotten better since last week.  She has been taking abx as prescribed.  Nothing makes it worse or better.  Denies ear pain, discharge, fever, sore throat.  Endorses improving cough.   History reviewed. No pertinent past medical history. History reviewed. No pertinent past surgical history. No family history on file. Social History  Substance Use Topics  . Smoking status: Never Smoker   . Smokeless tobacco: None  . Alcohol Use: No   OB History    No data available     Review of Systems All other systems negative unless otherwise stated in HPI    Allergies  Review of patient's allergies indicates no known allergies.  Home Medications   Prior to Admission medications   Medication Sig Start Date End Date Taking? Authorizing Provider  amoxicillin (AMOXIL) 500 MG capsule Take 1 capsule (500 mg total) by mouth 2 (two) times daily. 04/24/15 04/30/15  Barrett Henle, PA-C  benzonatate (TESSALON) 100 MG capsule Take 2 capsules (200 mg total) by mouth 2 (two) times daily as needed for cough. 04/24/15   Barrett Henle, PA-C  erythromycin ophthalmic ointment Place 1 application into the left eye every 6 (six) hours. Place 1/2 inch ribbon of ointment in the affected eye 4 times a day 12/01/13   Mellody Drown, PA-C  ibuprofen (ADVIL,MOTRIN) 800 MG tablet Take 1 tablet (800 mg total) by mouth 3 (three) times daily. 09/16/14   Joycie Peek, PA-C  oxymetazoline (AFRIN NASAL SPRAY) 0.05 % nasal spray Place 1 spray into both nostrils 2  (two) times daily. 04/24/15   Satira Sark Nadeau, PA-C   BP 127/75 mmHg  Pulse 65  Temp(Src) 98.4 F (36.9 C) (Oral)  Resp 16  SpO2 97%  LMP 01/30/2015 Physical Exam  Constitutional: She is oriented to person, place, and time. She appears well-developed and well-nourished.  HENT:  Head: Normocephalic and atraumatic.  Right Ear: No drainage. Tympanic membrane is erythematous. Tympanic membrane is not retracted and not bulging. No middle ear effusion. Decreased hearing is noted.  Left Ear: Tympanic membrane normal.  Nose: Nose normal.  Mouth/Throat: Uvula is midline and oropharynx is clear and moist. No oropharyngeal exudate, posterior oropharyngeal edema or posterior oropharyngeal erythema.  Eyes: Conjunctivae are normal. Pupils are equal, round, and reactive to light.  Neck: Normal range of motion. Neck supple.  Cardiovascular: Normal rate, regular rhythm and normal heart sounds.   No murmur heard. Pulmonary/Chest: Effort normal and breath sounds normal. No accessory muscle usage or stridor. No respiratory distress. She has no wheezes. She has no rhonchi. She has no rales.  Abdominal: Soft. Bowel sounds are normal. She exhibits no distension. There is no tenderness.  Musculoskeletal: Normal range of motion.  Lymphadenopathy:    She has no cervical adenopathy.  Neurological: She is alert and oriented to person, place, and time.  Speech clear without dysarthria.  Skin: Skin is warm and dry.  Psychiatric: She has a normal mood and affect. Her behavior is normal.    ED Course  Procedures (including critical care  time) Labs Review Labs Reviewed - No data to display  Imaging Review No results found. I have personally reviewed and evaluated these images and lab results as part of my medical decision-making.   EKG Interpretation None      MDM   Final diagnoses:  Right otitis media, recurrence not specified, unspecified chronicity, unspecified otitis media type     Patient presents with decreased hearing in the right ear after being diagnosed with AOM last week.  VSS, NAD.  On exam, right TM is erythematous.  No visualization of effusion or bulging TM.  Heart RRR, lungs  CTAB.  Suspect resolving AOM.  Patient advised to continue taking prescribed abx until completion.  Evaluation does not show pathology requring ongoing emergent intervention or admission. Pt is hemodynamically stable and mentating appropriately. Discussed findings/results and plan with patient/guardian, who agrees with plan. All questions answered. Return precautions discussed and outpatient follow up given.      Cheri FowlerKayla Jerah Esty, PA-C 04/30/15 [id number]  Courteney Randall AnLyn Mackuen, MD 04/30/15 1525

## 2015-04-30 NOTE — Discharge Instructions (Signed)
Otitis Media, Adult Otitis media is redness, soreness, and inflammation of the middle ear. Otitis media may be caused by allergies or, most commonly, by infection. Often it occurs as a complication of the common cold. SIGNS AND SYMPTOMS Symptoms of otitis media may include:  Earache.  Fever.  Ringing in your ear.  Headache.  Leakage of fluid from the ear. DIAGNOSIS To diagnose otitis media, your health care provider will examine your ear with an otoscope. This is an instrument that allows your health care provider to see into your ear in order to examine your eardrum. Your health care provider also will ask you questions about your symptoms. TREATMENT  Typically, otitis media resolves on its own within 3-5 days. Your health care provider may prescribe medicine to ease your symptoms of pain. If otitis media does not resolve within 5 days or is recurrent, your health care provider may prescribe antibiotic medicines if he or she suspects that a bacterial infection is the cause. HOME CARE INSTRUCTIONS   If you were prescribed an antibiotic medicine, finish it all even if you start to feel better.  Take medicines only as directed by your health care provider.  Keep all follow-up visits as directed by your health care provider. SEEK MEDICAL CARE IF:  You have otitis media only in one ear, or bleeding from your nose, or both.  You notice a lump on your neck.  You are not getting better in 3-5 days.  You feel worse instead of better. SEEK IMMEDIATE MEDICAL CARE IF:   You have pain that is not controlled with medicine.  You have swelling, redness, or pain around your ear or stiffness in your neck.  You notice that part of your face is paralyzed.  You notice that the bone behind your ear (mastoid) is tender when you touch it. MAKE SURE YOU:   Understand these instructions.  Will watch your condition.  Will get help right away if you are not doing well or get worse.   This  information is not intended to replace advice given to you by your health care provider. Make sure you discuss any questions you have with your health care provider.   Document Released: 03/12/2004 Document Revised: 06/28/2014 Document Reviewed: 01/02/2013 Elsevier Interactive Patient Education [employer]   Emergency Department Resource Guide 1) Find a Doctor and Pay Out of Pocket Although you won't have to find out who is covered by your insurance plan, it is a good idea to ask around and get recommendations. You will then need to call the office and see if the doctor you have chosen will accept you as a new patient and what types of options they offer for patients who are self-pay. Some doctors offer discounts or will set up payment plans for their patients who do not have insurance, but you will need to ask so you aren't surprised when you get to your appointment.  2) Contact Your Local Health Department Not all health departments have doctors that can see patients for sick visits, but many do, so it is worth a call to see if yours does. If you don't know where your local health department is, you can check in your phone book. The CDC also has a tool to help you locate your state's health department, and many state websites also have listings of all of their local health departments.  3) Find a Walk-in Clinic If your illness is not likely to be very severe or complicated, you  may want to try a walk in clinic. These are popping up all over the country in pharmacies, drugstores, and shopping centers. They're usually staffed by nurse practitioners or physician assistants that have been trained to treat common illnesses and complaints. They're usually fairly quick and inexpensive. However, if you have serious medical issues or chronic medical problems, these are probably not your best option.  No Primary Care Doctor: - Call Health Connect at  [phone] - they can help you locate a primary  care doctor that  accepts your insurance, provides certain services, etc. - Physician Referral Service- [phone]  Chronic Pain Problems: Organization         Address  Phone   Notes  [facility]  [phone] Patients need to be referred by their primary care doctor.   Medication Assistance: Organization         Address  Phone   Notes  [facility] Medication [facility] [address] [place], [place] [address] [phone] --Must be a resident of [facility] -- Must have NO insurance coverage whatsoever (no Medicaid/ Medicare, etc.) -- The pt. MUST have a primary care doctor that directs their care regularly and follows them in the community   [facility]ist  [phone]   [employer]  [phone]    Agencies that provide inexpensive medical care: Organization         Address  Phone   Notes  [facility]  [phone]   [facility]    [phone]   [facility] [address] [place], [place] [address] [phone]   [facility] [address] [place] [phone]   Planned Parenthood    [phone]   [facility]    [phone]   Community Health and [facility]  [address], [place] Phone:  [phone], Fax:  [phone] Hours of Operation:  9 am - 6 pm, M-F.  Also accepts Medicaid/Medicare and self-pay.  [facility]  [address], [place] Phone: [phone], Fax: [phone]. Hours of Operation:  8:30 am - 5:30 pm, M-F.  Also accepts Medicaid and self-pay.  [facility] [address], [place] Phone: [phone]   [facility] [address] Natasha Bence [place], [place] [phone], Ext. [phone] Mondays & Thursdays: 7-9 AM.  First 15 patients are seen on a first come, first serve basis.    Medicaid-accepting [facility]  Providers:  Organization         Address  Phone   Notes  [facility] [address], [place] [phone] Also accepts self-pay patients.  [facility] [address] Laurell Josephs [place], [place]  [phone]   [facility] [address], [place] [phone]   [facility] [address] [place] [phone]   Renaye Rakers [address], [place]   [phone] Only accepts [place] Access [place] patients after they have their name applied to their card.   Self-Pay (no insurance) in [facility]:  Organization         Address  Phone   Notes  Sickle Cell Patients, [facility] [address] [place], [place] [phone]   [facility] [address] [place], [place] (  [facility] [place]  [address], [address],  [phone]   [facility]/Dr. Osei-Bonsu  [address] [place] or [address], [place] [phone] Phone number for both [place] and [place] locations is the same.  Urgent Medical and [facility] [address], [place] [phone]   [facility] [address], [place] or [address] [phone]   [facility] [address], [place] [phone], phone; [phone], fax Sees patients 1st and 3rd Saturday of every month.  Must not qualify for public or private insurance (i.e. Medicaid, Medicare, [place] Health Choice, Veterans' Benefits)  Household income should be no more than 200% of the poverty level The clinic cannot treat you if you are pregnant or think you are pregnant  Sexually transmitted diseases are not treated at the clinic.    Dental Care: Organization         Address  Phone  Notes  [facility] [phone] Accepts children up to age 11 who are enrolled in [place] or [place]; pregnant women with a Medicaid card; and children who have applied for Medicaid or [place] Health Choice, but were declined, whose parents can pay a reduced fee at time of service.  [employer] Department of [facility]  [address], [place] [phone] Accepts children up to age 40 who are enrolled in [place] or [place]; pregnant women with a Medicaid card; and children who have applied for Medicaid or [place] Health Choice, but were declined, whose parents can pay a reduced fee at time of service.  [facility]  [place] [phone] Patients are seen by appointment only. Walk-ins are not accepted. [place] will see patients 6 years of age and older. Monday - Tuesday (8am-5pm) Most Wednesdays (8:30-5pm) $30 per visit, cash only  [facility]  [address], [facility] [phone] Patients are seen by appointment only. Walk-ins are not accepted. [place] will see patients 54 years of age and older. One Wednesday Evening (Monthly: Volunteer Based).  $30 per visit, cash only  [place]  [phone] for adults; Children under age 85, call Graduate Pediatric Dentistry at [phone]. Children aged 43-14, please call [phone] to request a pediatric application.  Dental services are provided in all areas of dental care including fillings, crowns and bridges, complete and partial dentures, implants, gum treatment, root canals, and extractions. Preventive care is also provided. Treatment is provided to both adults and children. Patients are selected via a lottery and there is often a waiting list.   [facility] [address] [place]  [phone] [email or web address]   [facility]  [address] [place], [place] [phone], Ext. [phone] Second and Fourth Thursday of each month, opens at 6:30 AM; Clinic ends at 9 AM.  Patients are seen on a first-come first-served basis, and a limited number are seen during each clinic.   [facility]  [address] Hillard Danker [place], [place] [phone]   Eligibility Requirements You must have lived in [place], [place], or [place] counties for at least the last three months.   You cannot be eligible for  state or Teacher, music, including [employer], [place], or [employer].   You generally cannot be eligible for healthcare insurance through your employer.    How to apply: Eligibility screenings are held every Tuesday and Wednesday afternoon from 1:00 pm until 4:00 pm. You do not need an appointment for the interview!  [facility] [address] [place], [place] [phone]   [facility] Health Department  [phone]   [facility] Health Department  [phone]   [facility] Health Department  [phone]    Behavioral Health Resources in the Community: Intensive Outpatient Programs Organization         Address  Phone  Notes  [facility] [address], [place], [place] [phone]   [facility] Outpatient [address] [place], [place] [phone]   [facility] [address] [place], [place]  [phone]   [facility] [address]  [place], [place] [phone] or [phone]   Substance Abuse Resources Organization         Address  Phone  Notes  [facility] Drug Services  [phone]   [facility]  [phone]   [facility]  [phone]   Floydene Flock  [phone]   Residential & Outpatient Substance Abuse Program  [phone]   Psychological Services Organization         Address  Phone  Notes  [facility]  [phone]    [facility] Services  [phone]   [facility] [address], [place] [phone] or [phone]    Mobile Crisis Teams Organization         Address  Phone  Notes  [facility], Mobile Crisis Care Unit  [phone]   [facility]  [address] [place], [place] [phone]   Doristine Locks [address] [place] [phone]    Self-Help/Support Groups Organization         Address  Phone             Notes  [facility] [place] - variety of support groups  336- I7437963 Call for more information  Narcotics Anonymous (NA), [facility] [address], [employer] [place]  2 meetings at this location   Statistician         Address  Phone  Notes  [facility] [address],    [place]  [phone]   [facility]  [address] [place] [address], [place], [place] [phone]   [facility] [address] [place], [place] [phone] Admissions: 8am-3pm M-F  [facility] [address],    [place], [place] [phone]   [facility] [address] Starling Manns [place], [place] [phone]   [facility] [address]  [place], [place] [phone]   [facility] - Intensive Outpatient [address] Laurell Josephs [address], [place] [employer], [place] [phone]   [facility] ([facility] [address]  [place], [place] [phone] or [phone]   [facility] ([facility]) [address] [place], [place] [phone] Accepts Medicaid  [facility] [place] [address],  [place] [phone] Substance Abuse/Addiction Treatment   [facility] Organization         Address  Phone  Notes  [facility]  [phone]   Angie Fava, PhD [address] [place], [place]   [phone]  [phone]) [phone] or [phone]) [phone]    [facility]   [address] [place] [address] [place], [place] [phone](336) [phone]   [facility] [address], [place], [place] [phone]) [phone] Insurance/Medicaid/sponsorship through [facility]  [facility] Families [address]                                    [place], [place] [phone]) [phone] Therapy/tele-psych/case  [facility] [address]   [place], [place] [phone](336) [phone]    Dr. Lolly MustacheArfeen  [phone]) [phone]   [facility] [place]  [employer] [facility] 1) [address], [place] 2) [address], [place] 3)  [address], [place] [phone](336) [phone]) [phone]  [phone](336) [phone]   [facility] Child Abuse Hotline [phone](336) [phone] or [phone](336) [phone] (After Hours)

## 2015-04-30 NOTE — ED Notes (Signed)
Patient states was here last week for URI and otalgia.   Patient states she is not having ear pain currently, but states "the muffling in my R ear is worse than it was last week".

## 2015-06-23 ENCOUNTER — Emergency Department (HOSPITAL_COMMUNITY)
Admission: EM | Admit: 2015-06-23 | Discharge: 2015-06-23 | Disposition: A | Discharge status: Home / Self Care | Payer: Self-pay | Attending: Emergency Medicine | Admitting: Emergency Medicine

## 2015-06-23 ENCOUNTER — Encounter (HOSPITAL_COMMUNITY): Payer: Self-pay

## 2015-06-23 DIAGNOSIS — Z3202 Encounter for pregnancy test, result negative: Secondary | ICD-10-CM | POA: Insufficient documentation

## 2015-06-23 DIAGNOSIS — Z792 Long term (current) use of antibiotics: Secondary | ICD-10-CM | POA: Insufficient documentation

## 2015-06-23 DIAGNOSIS — Z791 Long term (current) use of non-steroidal anti-inflammatories (NSAID): Secondary | ICD-10-CM | POA: Insufficient documentation

## 2015-06-23 DIAGNOSIS — B9689 Other specified bacterial agents as the cause of diseases classified elsewhere: Secondary | ICD-10-CM

## 2015-06-23 DIAGNOSIS — N760 Acute vaginitis: Secondary | ICD-10-CM | POA: Insufficient documentation

## 2015-06-23 LAB — WET PREP, GENITAL
Sperm: NONE SEEN
Trich, Wet Prep: NONE SEEN

## 2015-06-23 LAB — URINALYSIS, ROUTINE W REFLEX MICROSCOPIC
Bilirubin Urine: NEGATIVE
Glucose, UA: NEGATIVE mg/dL
Hgb urine dipstick: NEGATIVE
Ketones, ur: NEGATIVE mg/dL
Nitrite: NEGATIVE
Protein, ur: NEGATIVE mg/dL
Specific Gravity, Urine: 1.031 — ABNORMAL HIGH (ref 1.005–1.030)
pH: 6 (ref 5.0–8.0)

## 2015-06-23 LAB — URINE MICROSCOPIC-ADD ON: RBC / HPF: NONE SEEN RBC/hpf (ref 0–5)

## 2015-06-23 LAB — POC URINE PREG, ED: Preg Test, Ur: NEGATIVE

## 2015-06-23 MED ORDER — METRONIDAZOLE 500 MG PO TABS
2000.0000 mg | ORAL_TABLET | Freq: Once | ORAL | Status: AC
  Administered 2015-06-23: 2000 mg via ORAL
  Filled 2015-06-23: qty 4

## 2015-06-23 MED ORDER — FLUCONAZOLE 100 MG PO TABS
200.0000 mg | ORAL_TABLET | Freq: Once | ORAL | Status: AC
  Administered 2015-06-23: 200 mg via ORAL
  Filled 2015-06-23: qty 2

## 2015-06-23 NOTE — Discharge Instructions (Signed)

## 2015-06-23 NOTE — ED Notes (Signed)
Patient here with vaginal pain x 1 week, denies discharge

## 2015-06-24 LAB — GC/CHLAMYDIA PROBE AMP (~~LOC~~) NOT AT ARMC
Chlamydia: NEGATIVE
Neisseria Gonorrhea: NEGATIVE

## 2015-06-24 LAB — RPR: RPR Ser Ql: NONREACTIVE

## 2015-07-06 NOTE — ED Provider Notes (Signed)
CSN: [id number]     Arrival date & time 06/23/15  1006 History   First MD Initiated Contact with Patient 06/23/15 1108     Chief Complaint  Patient presents with  . Vaginal Pain     (Consider location/radiation/quality/duration/timing/severity/associated sxs/prior Treatment) HPI    24yF with vaginal discomfort. "I just feel irritated." Onset about a week ago. Persistent since then. Some white discharge. No fever or chills. No urinary complaints. No unusual bleeding. No abdominal or back pain. Has not noticed any lesions.   History reviewed. No pertinent past medical history. History reviewed. No pertinent past surgical history. No family history on file. Social History  Substance Use Topics  . Smoking status: Never Smoker   . Smokeless tobacco: None  . Alcohol Use: No   OB History    No data available     Review of Systems  All systems reviewed and negative, other than as noted in HPI.   Allergies  Review of patient's allergies indicates no known allergies.  Home Medications   Prior to Admission medications   Medication Sig Start Date End Date Taking? Authorizing Provider  ibuprofen (ADVIL,MOTRIN) 800 MG tablet Take 1 tablet (800 mg total) by mouth 3 (three) times daily. 09/16/14  Yes Benjamin Cartner, PA-C  penicillin v potassium (VEETID) 250 MG tablet Take 250 mg by mouth 4 (four) times daily.   Yes Historical Provider, MD   BP 104/68 mmHg  Pulse 71  Temp(Src) 98.2 F (36.8 C) (Oral)  Resp 18  Ht 5\' 3"  (1.6 m)  Wt 275 lb (124.739 kg)  BMI 48.73 kg/m2  SpO2 100%  LMP 06/10/2015 Physical Exam  Constitutional: She appears well-developed and well-nourished. No distress.  HENT:  Head: Normocephalic and atraumatic.  Eyes: Conjunctivae are normal. Right eye exhibits no discharge. Left eye exhibits no discharge.  Neck: Neck supple.  Cardiovascular: Normal rate, regular rhythm and normal heart sounds.  Exam reveals no gallop and no friction rub.   No murmur  heard. Pulmonary/Chest: Effort normal and breath sounds normal. No respiratory distress.  Abdominal: Soft. She exhibits no distension. There is no tenderness.  Genitourinary:  Chaperone present. Normal external genitalia. Moderate thick white discharge  Musculoskeletal: She exhibits no edema or tenderness.  Neurological: She is alert.  Skin: Skin is warm and dry.  Psychiatric: She has a normal mood and affect. Her behavior is normal. Thought content normal.  Nursing note and vitals reviewed.   ED Course  Procedures (including critical care time) Labs Review Labs Reviewed  WET PREP, GENITAL - Abnormal; Notable for the following:    Yeast Wet Prep HPF POC PRESENT (*)    Clue Cells Wet Prep HPF POC PRESENT (*)    WBC, Wet Prep HPF POC FEW (*)    All other components within normal limits  URINALYSIS, ROUTINE W REFLEX MICROSCOPIC (NOT AT [facility]) - Abnormal; Notable for the following:    APPearance CLOUDY (*)    Specific Gravity, Urine 1.031 (*)    Leukocytes, UA SMALL (*)    All other components within normal limits  URINE MICROSCOPIC-ADD ON - Abnormal; Notable for the following:    Squamous Epithelial / LPF 6-30 (*)    Bacteria, UA FEW (*)    All other components within normal limits  RPR  POC URINE PREG, ED  GC/CHLAMYDIA PROBE AMP ([place]) NOT AT [facility]    Imaging Review No results found. I have personally reviewed and evaluated these images and lab results as part of  my medical decision-making.   EKG Interpretation None      MDM   Final diagnoses:  Bacterial vaginosis        Raeford Razor, MD 07/06/15 [phone]

## 2016-07-28 ENCOUNTER — Encounter (HOSPITAL_COMMUNITY): Payer: Self-pay | Admitting: Emergency Medicine

## 2016-07-28 DIAGNOSIS — Z5321 Procedure and treatment not carried out due to patient leaving prior to being seen by health care provider: Secondary | ICD-10-CM | POA: Insufficient documentation

## 2016-07-28 DIAGNOSIS — M25561 Pain in right knee: Secondary | ICD-10-CM | POA: Insufficient documentation
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
## 2016-07-29 ENCOUNTER — Emergency Department (HOSPITAL_COMMUNITY)
Admission: EM | Admit: 2016-07-29 | Discharge: 2016-07-29 | Disposition: A | Discharge status: Home / Self Care | Payer: Self-pay | Attending: Dermatology | Admitting: Dermatology

## 2016-07-29 NOTE — ED Notes (Signed)
Patient came up to nurse's station, stating she wanted to leave. Encouraged patient to stay and notified her of wait. Pt denied wanting to stay and left ED ambulatory with steady gait. NAD noted.

## 2016-08-14 ENCOUNTER — Emergency Department (HOSPITAL_COMMUNITY)
Admission: EM | Admit: 2016-08-14 | Discharge: 2016-08-14 | Disposition: A | Discharge status: Home / Self Care | Payer: No Typology Code available for payment source | Attending: Emergency Medicine | Admitting: Emergency Medicine

## 2016-08-14 ENCOUNTER — Encounter (HOSPITAL_COMMUNITY): Payer: Self-pay | Admitting: Emergency Medicine

## 2016-08-14 DIAGNOSIS — Y999 Unspecified external cause status: Secondary | ICD-10-CM | POA: Diagnosis not present

## 2016-08-14 DIAGNOSIS — S3992XA Unspecified injury of lower back, initial encounter: Secondary | ICD-10-CM | POA: Diagnosis present

## 2016-08-14 DIAGNOSIS — Y939 Activity, unspecified: Secondary | ICD-10-CM | POA: Diagnosis not present

## 2016-08-14 DIAGNOSIS — S39012A Strain of muscle, fascia and tendon of lower back, initial encounter: Secondary | ICD-10-CM | POA: Insufficient documentation

## 2016-08-14 DIAGNOSIS — Y92410 Unspecified street and highway as the place of occurrence of the external cause: Secondary | ICD-10-CM | POA: Insufficient documentation

## 2016-08-14 MED ORDER — CYCLOBENZAPRINE HCL 10 MG PO TABS: 10.0000 mg | ORAL_TABLET | Freq: Two times a day (BID) | ORAL | 0 refills | Status: AC | PRN

## 2016-08-14 MED ORDER — IBUPROFEN 800 MG PO TABS: 800.0000 mg | ORAL_TABLET | Freq: Three times a day (TID) | ORAL | 0 refills | Status: AC
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
## 2016-08-14 NOTE — ED Notes (Signed)
Declined W/C at D/C and was escorted to lobby by RN. 
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
## 2016-09-20 ENCOUNTER — Emergency Department (HOSPITAL_COMMUNITY)
Admission: EM | Admit: 2016-09-20 | Discharge: 2016-09-20 | Disposition: A | Discharge status: Home / Self Care | Payer: Self-pay | Attending: Emergency Medicine | Admitting: Emergency Medicine

## 2016-09-20 ENCOUNTER — Emergency Department (HOSPITAL_COMMUNITY): Payer: Self-pay

## 2016-09-20 ENCOUNTER — Encounter (HOSPITAL_COMMUNITY): Payer: Self-pay

## 2016-09-20 DIAGNOSIS — Z79899 Other long term (current) drug therapy: Secondary | ICD-10-CM | POA: Insufficient documentation

## 2016-09-20 DIAGNOSIS — R059 Cough, unspecified: Secondary | ICD-10-CM

## 2016-09-20 DIAGNOSIS — R05 Cough: Secondary | ICD-10-CM | POA: Insufficient documentation

## 2016-09-20 MED ORDER — FAMOTIDINE 20 MG PO TABS: 20.0000 mg | ORAL_TABLET | Freq: Two times a day (BID) | ORAL | 0 refills | Status: AC

## 2016-09-20 MED ORDER — FEXOFENADINE HCL 60 MG PO TABS: 60.0000 mg | ORAL_TABLET | Freq: Two times a day (BID) | ORAL | 0 refills | Status: AC

## 2016-09-20 MED ORDER — FLUTICASONE PROPIONATE 50 MCG/ACT NA SUSP: 2.0000 | Freq: Every day | NASAL | 0 refills | Status: AC

## 2016-09-20 NOTE — ED Provider Notes (Signed)
[facility] Provider Note   CSN: [id number] Arrival date & time: 09/20/16  4098     History   Chief Complaint Chief Complaint  Patient presents with  . Cough    HPI Mary Atkins is a 26 y.o. female.  HPI   Patient presents with persistent dry cough x 3 months.  She had a URI with nasal congestion, cough, fevers, myalgias in mid-January.  Since then she has had persistent cough, it occurs at all times of day.  She feels that there is phlegm at the bottom of her throat.  She tried allergy medications for three nights without improvement.  Has some nasal congestion and year-round allergies.  She also occasionally has heartburn after eating. Denies recent fevers, chills, myalgias, sore throat, SOB, CP, leg swelling.   Denies recent immobilization, exogenous estrogen, leg swelling.  Denies possibility of pregnancy.  She does not smoke.  She has never lived or travelled extensively outside the country and has no known contact with anyone with TB.    History reviewed. No pertinent past medical history.  There are no active problems to display for this patient.   Past Surgical History:  Procedure Laterality Date  . WISDOM TOOTH EXTRACTION      OB History    No data available       Home Medications    Prior to Admission medications   Medication Sig Start Date End Date Taking? Authorizing Provider  cyclobenzaprine (FLEXERIL) 10 MG tablet Take 1 tablet (10 mg total) by mouth 2 (two) times daily as needed for muscle spasms. 08/14/16   Roxy Horseman, PA-C  famotidine (PEPCID) 20 MG tablet Take 1 tablet (20 mg total) by mouth 2 (two) times daily. 09/20/16   Trixie Dredge, PA-C  fexofenadine (ALLEGRA) 60 MG tablet Take 1 tablet (60 mg total) by mouth 2 (two) times daily. 09/20/16   Trixie Dredge, PA-C  fluticasone (FLONASE) 50 MCG/ACT nasal spray Place 2 sprays into both nostrils daily. 09/20/16   Trixie Dredge, PA-C  ibuprofen (ADVIL,MOTRIN) 800 MG tablet Take 1 tablet (800 mg total)  by mouth 3 (three) times daily. 08/14/16   Roxy Horseman, PA-C  penicillin v potassium (VEETID) 250 MG tablet Take 250 mg by mouth 4 (four) times daily.    Historical Provider, MD    Family History History reviewed. No pertinent family history.  Social History Social History  Substance Use Topics  . Smoking status: Never Smoker  . Smokeless tobacco: Never Used  . Alcohol use No     Allergies   Patient has no known allergies.   Review of Systems Review of Systems  All other systems reviewed and are negative.    Physical Exam Updated Vital Signs BP 129/75   Pulse 80   Temp 99 F (37.2 C) (Oral)   Resp 15   Ht  (1.6 m)   Wt 127 kg   LMP 09/20/2016   SpO2 100%   BMI 49.60 kg/m   Physical Exam  Constitutional: She appears well-developed and well-nourished. No distress.  HENT:  Head: Normocephalic and atraumatic.  Neck: Neck supple.  Cardiovascular: Normal rate and regular rhythm.   Pulmonary/Chest: Effort normal and breath sounds normal. No tachypnea. No respiratory distress. She has no decreased breath sounds. She has no wheezes. She has no rhonchi. She has no rales.  Abdominal: Soft. She exhibits no distension. There is no tenderness. There is no rebound and no guarding.  Musculoskeletal: She exhibits no edema.  Neurological: She is  alert.  Skin: She is not diaphoretic.  Nursing note and vitals reviewed.    ED Treatments / Results  Labs (all labs ordered are listed, but only abnormal results are displayed) Labs Reviewed - No data to display  EKG  EKG Interpretation None       Radiology Dg Chest 2 View  Result Date: 09/20/2016 CLINICAL DATA:  Cough for 3 months. EXAM: CHEST  2 VIEW COMPARISON:  Chest radiograph January 11, 2013 FINDINGS: Cardiomediastinal silhouette is normal. No pleural effusions or focal consolidations. Trachea projects midline and there is no pneumothorax. Soft tissue planes and included osseous structures are non-suspicious.  IMPRESSION: Normal chest. Electronically Signed   By: Awilda Metro M.D.   On: 09/20/2016 06:41    Procedures Procedures (including critical care time)  Medications Ordered in ED Medications - No data to display   Initial Impression / Assessment and Plan / ED Course  I have reviewed the triage vital signs and the nursing notes.  Pertinent labs & imaging results that were available during my care of the patient were reviewed by me and considered in my medical decision making (see chart for details).     Afebrile, nontoxic patient with chronic cough.  CXR negative.   Postnasal drip vs reflux vs bronchitis.    D/C home with prescriptions, resources for PCP follow up.  Discussed result, findings, treatment, and follow up  with patient.  Pt given return precautions.  Pt verbalizes understanding and agrees with plan.       Final Clinical Impressions(s) / ED Diagnoses   Final diagnoses:  Cough    New Prescriptions Discharge Medication List as of 09/20/2016  7:05 AM    START taking these medications   Details  famotidine (PEPCID) 20 MG tablet Take 1 tablet (20 mg total) by mouth 2 (two) times daily., Starting Mon 09/20/2016, Print    fexofenadine (ALLEGRA) 60 MG tablet Take 1 tablet (60 mg total) by mouth 2 (two) times daily., Starting Mon 09/20/2016, Print    fluticasone (FLONASE) 50 MCG/ACT nasal spray Place 2 sprays into both nostrils daily., Starting Mon 09/20/2016, Print         [place], PA-C 09/20/16 1456    Maia Plan, MD 09/20/16 [phone]

## 2016-09-20 NOTE — ED Notes (Signed)
Patient transported to X-ray 

## 2016-09-20 NOTE — Discharge Instructions (Signed)
Read the information below.  Use the prescribed medication as directed.  Please discuss all new medications with your pharmacist.  You may return to the Emergency Department at any time for worsening condition or any new symptoms that concern you.    If you develop chest pain, shortness of breath, fever, you pass out, or become weak or dizzy, return to the ER for a recheck.    °

## 2016-09-20 NOTE — ED Triage Notes (Signed)
Pt states she has had chronic cough since January that has not gone away; pt states cough is non productive; pt denies pain at triage; pt a&ox 4

## 2017-04-11 ENCOUNTER — Encounter (HOSPITAL_COMMUNITY): Payer: Self-pay | Admitting: Emergency Medicine

## 2017-04-11 DIAGNOSIS — Z79899 Other long term (current) drug therapy: Secondary | ICD-10-CM | POA: Insufficient documentation

## 2017-04-11 DIAGNOSIS — Y929 Unspecified place or not applicable: Secondary | ICD-10-CM | POA: Insufficient documentation

## 2017-04-11 DIAGNOSIS — S6992XA Unspecified injury of left wrist, hand and finger(s), initial encounter: Secondary | ICD-10-CM | POA: Insufficient documentation

## 2017-04-11 DIAGNOSIS — Y9301 Activity, walking, marching and hiking: Secondary | ICD-10-CM | POA: Insufficient documentation

## 2017-04-11 DIAGNOSIS — W0110XA Fall on same level from slipping, tripping and stumbling with subsequent striking against unspecified object, initial encounter: Secondary | ICD-10-CM | POA: Insufficient documentation

## 2017-04-11 DIAGNOSIS — Y999 Unspecified external cause status: Secondary | ICD-10-CM | POA: Insufficient documentation

## 2017-04-11 MED ORDER — OXYCODONE-ACETAMINOPHEN 5-325 MG PO TABS
ORAL_TABLET | ORAL | Status: AC
  Filled 2017-04-11: qty 1

## 2017-04-11 MED ORDER — OXYCODONE-ACETAMINOPHEN 5-325 MG PO TABS
1.0000 | ORAL_TABLET | Freq: Once | ORAL | Status: AC
  Administered 2017-04-11: 1 via ORAL

## 2017-04-11 NOTE — ED Triage Notes (Signed)
Patient tripped and fell this evening , injured her left wrist with pain and mild swelling .

## 2017-04-12 ENCOUNTER — Emergency Department (HOSPITAL_COMMUNITY): Payer: Self-pay

## 2017-04-12 ENCOUNTER — Emergency Department (HOSPITAL_COMMUNITY)
Admission: EM | Admit: 2017-04-12 | Discharge: 2017-04-12 | Disposition: A | Discharge status: Home / Self Care | Payer: Self-pay | Attending: Emergency Medicine | Admitting: Emergency Medicine

## 2017-04-12 DIAGNOSIS — S52572A Other intraarticular fracture of lower end of left radius, initial encounter for closed fracture: Secondary | ICD-10-CM

## 2017-04-12 MED ORDER — OXYCODONE-ACETAMINOPHEN 5-325 MG PO TABS
1.0000 | ORAL_TABLET | Freq: Once | ORAL | Status: AC
  Administered 2017-04-12: 1 via ORAL
  Filled 2017-04-12: qty 1

## 2017-04-12 MED ORDER — HYDROCODONE-ACETAMINOPHEN 5-325 MG PO TABS: 1.0000 | ORAL_TABLET | Freq: Four times a day (QID) | ORAL | 0 refills | Status: AC | PRN

## 2017-04-12 NOTE — ED Notes (Signed)
Pt verbalizes understanding of d/c instructions. Pt received prescriptions. Pt ambulatory at d/c with all belongings.  
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
## 2021-07-07 ENCOUNTER — Other Ambulatory Visit: Payer: Self-pay

## 2021-07-07 ENCOUNTER — Emergency Department (HOSPITAL_COMMUNITY)
Admission: EM | Admit: 2021-07-07 | Discharge: 2021-07-09 | Disposition: A | Discharge status: Home / Self Care | Payer: Self-pay | Attending: Emergency Medicine | Admitting: Emergency Medicine

## 2021-07-07 DIAGNOSIS — R002 Palpitations: Secondary | ICD-10-CM | POA: Insufficient documentation

## 2021-07-07 DIAGNOSIS — D509 Iron deficiency anemia, unspecified: Secondary | ICD-10-CM | POA: Insufficient documentation

## 2021-07-08 ENCOUNTER — Other Ambulatory Visit: Payer: Self-pay

## 2021-07-08 ENCOUNTER — Emergency Department (HOSPITAL_COMMUNITY): Payer: Self-pay

## 2021-07-08 LAB — CBC WITH DIFFERENTIAL/PLATELET
Abs Immature Granulocytes: 0 10*3/uL (ref 0.00–0.07)
Basophils Absolute: 0.1 10*3/uL (ref 0.0–0.1)
Basophils Relative: 1 %
Eosinophils Absolute: 0.1 10*3/uL (ref 0.0–0.5)
Eosinophils Relative: 1 %
HCT: 33 % — ABNORMAL LOW (ref 36.0–46.0)
Hemoglobin: 9.5 g/dL — ABNORMAL LOW (ref 12.0–15.0)
Immature Granulocytes: 0 %
Lymphocytes Relative: 31 %
Lymphs Abs: 1.7 10*3/uL (ref 0.7–4.0)
MCH: 21.5 pg — ABNORMAL LOW (ref 26.0–34.0)
MCHC: 28.8 g/dL — ABNORMAL LOW (ref 30.0–36.0)
MCV: 74.8 fL — ABNORMAL LOW (ref 80.0–100.0)
Monocytes Absolute: 0.5 10*3/uL (ref 0.1–1.0)
Monocytes Relative: 9 %
Neutro Abs: 3.2 10*3/uL (ref 1.7–7.7)
Neutrophils Relative %: 58 %
Platelets: 348 10*3/uL (ref 150–400)
RBC: 4.41 MIL/uL (ref 3.87–5.11)
RDW: 17.2 % — ABNORMAL HIGH (ref 11.5–15.5)
WBC: 5.4 10*3/uL (ref 4.0–10.5)
nRBC: 0 % (ref 0.0–0.2)

## 2021-07-08 LAB — BASIC METABOLIC PANEL
Anion gap: 8 (ref 5–15)
BUN: 16 mg/dL (ref 6–20)
CO2: 26 mmol/L (ref 22–32)
Calcium: 8.7 mg/dL — ABNORMAL LOW (ref 8.9–10.3)
Chloride: 106 mmol/L (ref 98–111)
Creatinine, Ser: 1.04 mg/dL — ABNORMAL HIGH (ref 0.44–1.00)
GFR, Estimated: >60 mL/min (ref >60)
Glucose, Bld: 99 mg/dL (ref 70–99)
Potassium: 4.1 mmol/L (ref 3.5–5.1)
Sodium: 140 mmol/L (ref 135–145)

## 2021-07-08 LAB — TROPONIN I (HIGH SENSITIVITY)
Troponin I (High Sensitivity): 3 ng/L (ref <18)
Troponin I (High Sensitivity): 2 ng/L (ref <18)

## 2021-07-08 LAB — TSH: TSH: 1.51 u[IU]/mL (ref 0.350–4.500)

## 2021-07-08 LAB — T4, FREE: Free T4: 1 ng/dL (ref 0.61–1.12)

## 2021-07-08 NOTE — ED Triage Notes (Signed)
Pt reported to ED with c/o palpitations accompanied by intermittent cough since Friday. Denies any other symptoms at this time.

## 2021-07-08 NOTE — ED Provider Notes (Signed)
[place] EMERGENCY DEPARTMENT Provider Note  CSN: [place]:[id number] Arrival date & time: 07/07/21 2336  Chief Complaint(s) Palpitations  HPI Mary Atkins is a 31 y.o. female    Palpitations Onset quality:  Sudden Duration:  5 days Timing:  Intermittent Progression:  Waxing and waning Chronicity:  Recurrent Context: anxiety and dehydration   Context: not appetite suppressants, not blood loss, not bronchodilators, not caffeine, not exercise, not hyperventilation, not illicit drugs, not nicotine and not stimulant use   Relieved by:  Nothing Worsened by:  Nothing Associated symptoms: no back pain, no chest pain, no chest pressure, no dizziness, no lower extremity edema, no nausea, no shortness of breath and no vomiting   Risk factors: no diabetes mellitus, no heart disease, no hx of PE, no hx of thyroid disease, no hyperthyroidism, no OTC sinus medications and no stress    Past Medical History No past medical history on file. There are no problems to display for this patient.  Home Medication(s) Prior to Admission medications   Medication Sig Start Date End Date Taking? Authorizing Provider  cyclobenzaprine (FLEXERIL) 10 MG tablet Take 1 tablet (10 mg total) by mouth 2 (two) times daily as needed for muscle spasms. 08/14/16   Montine Circle, PA-C  famotidine (PEPCID) 20 MG tablet Take 1 tablet (20 mg total) by mouth 2 (two) times daily. 09/20/16   Clayton Bibles, PA-C  fexofenadine (ALLEGRA) 60 MG tablet Take 1 tablet (60 mg total) by mouth 2 (two) times daily. 09/20/16   Clayton Bibles, PA-C  fluticasone (FLONASE) 50 MCG/ACT nasal spray Place 2 sprays into both nostrils daily. 09/20/16   Clayton Bibles, PA-C  HYDROcodone-acetaminophen (NORCO/VICODIN) 5-325 MG tablet Take 1 tablet by mouth every 6 (six) hours as needed. 04/12/17   Horton, Barbette Hair, MD  ibuprofen (ADVIL,MOTRIN) 800 MG tablet Take 1 tablet (800 mg total) by mouth 3 (three) times daily. 08/14/16   Montine Circle, PA-C  penicillin v potassium (VEETID) 250 MG tablet Take 250 mg by mouth 4 (four) times daily.    [provider]                                                                                                                                    Allergies Patient has no known allergies.  Review of Systems Review of Systems  Respiratory:  Negative for shortness of breath.   Cardiovascular:  Positive for palpitations. Negative for chest pain.  Gastrointestinal:  Negative for nausea and vomiting.  Musculoskeletal:  Negative for back pain.  Neurological:  Negative for dizziness.  As noted in HPI  Physical Exam Vital Signs  I have reviewed the triage vital signs BP 139/76    Pulse 66    Temp 98.4 F (36.9 C) (Oral)    Resp 16    SpO2 100%   Physical Exam Vitals reviewed.  Constitutional:      General: She is not  in acute distress.    Appearance: She is well-developed. She is not diaphoretic.  HENT:     Head: Normocephalic and atraumatic.     Nose: Nose normal.  Eyes:     General: No scleral icterus.       Right eye: No discharge.        Left eye: No discharge.     Conjunctiva/sclera: Conjunctivae normal.     Pupils: Pupils are equal, round, and reactive to light.  Cardiovascular:     Rate and Rhythm: Normal rate and regular rhythm.     Heart sounds: No murmur heard.   No friction rub. No gallop.  Pulmonary:     Effort: Pulmonary effort is normal. No respiratory distress.     Breath sounds: Normal breath sounds. No stridor. No rales.  Abdominal:     General: There is no distension.     Palpations: Abdomen is soft.     Tenderness: There is no abdominal tenderness.  Musculoskeletal:        General: No tenderness.     Cervical back: Normal range of motion and neck supple.  Skin:    General: Skin is warm and dry.     Findings: No erythema or rash.  Neurological:     Mental Status: She is alert and oriented to person, place, and time.    ED Results and  Treatments Labs (all labs ordered are listed, but only abnormal results are displayed) Labs Reviewed  CBC WITH DIFFERENTIAL/PLATELET - Abnormal; Notable for the following components:      Result Value   Hemoglobin 9.5 (*)    HCT 33.0 (*)    MCV 74.8 (*)    MCH 21.5 (*)    MCHC 28.8 (*)    RDW 17.2 (*)    All other components within normal limits  BASIC METABOLIC PANEL - Abnormal; Notable for the following components:   Creatinine, Ser 1.04 (*)    Calcium 8.7 (*)    All other components within normal limits  TSH  T4, FREE  I-STAT BETA HCG BLOOD, ED ([facility], [facility], [facility] ONLY)  TROPONIN I (HIGH SENSITIVITY)  TROPONIN I (HIGH SENSITIVITY)                                                                                                                         EKG  EKG Interpretation  Date/Time:  Wednesday July 08 2021 00:27:17 EST Ventricular Rate:  74 PR Interval:  144 QRS Duration: 72 QT Interval:  356 QTC Calculation: 395 R Axis:   84 Text Interpretation: Sinus rhythm with Premature atrial complexes Otherwise normal ECG When compared with ECG of 11-Jan-2013 15:57, PREVIOUS ECG IS PRESENT Confirmed by Addison Lank [phone]) on 07/08/2021 4:53:44 AM       Radiology DG Chest 2 View  Result Date: 07/08/2021 CLINICAL DATA:  Cardiac palpitations EXAM: CHEST - 2 VIEW COMPARISON:  09/20/2016 FINDINGS: The heart size and mediastinal contours are within normal limits. Both lungs  are clear. The visualized skeletal structures are unremarkable. IMPRESSION: No active cardiopulmonary disease. Electronically Signed   By: Inez Catalina M.D.   On: 07/08/2021 01:34    Pertinent labs & imaging results that were available during my care of the patient were reviewed by me and considered in my medical decision making (see MDM for details).  Medications Ordered in ED Medications - No data to display                                                                                                                                    Procedures Procedures  (including critical care time)  Medical Decision Making / ED Course        Intermittent palpitations Otherwise asymptomatic Will assess for any electrolyte derangements, evidence of dehydration and renal insufficiency Will assess thyroid panel Will look for evidence of anemia Obtain EKG to assess for any significant interval changes  Work-up ordered to assess concerns above.  Labs and imaging independently interpreted by me and noted below: CBC without leukocytosis.  Patient does have microcytic anemia at 9.5, down from 10.8 eight years ago. No significant electrolyte derangements.  Mild renal insufficiency. Thyroid panel wnl On my read of the chest x-ray, there was no evidence suggestive of pneumonia, pneumothorax, pneumomediastinum, pulmonary edema concerning for new or exacerbation of heart failure, abnormal contour of the mediastinum to suggest dissection, and no evidence of acute injuries. EKG without acute ischemic changes, dysrhythmias or blocks. PACs noted  Management: No intervention needed         Assessment/Plan:                                                                                                                                              Palpitations Intermittent PACs Patient has has evidence of microcytic anemia likely iron deficiency Recommended iron supplements. Recommended she establish care with a primary care provider  Final Clinical Impression(s) / ED Diagnoses Final diagnoses:  Palpitations  Iron deficiency anemia, unspecified iron deficiency anemia type           This chart was dictated using voice recognition software.  Despite best efforts to proofread,  errors can occur which can change the documentation meaning.    Fatima Blank, MD 07/08/21 [phone]

## 2021-07-08 NOTE — ED Provider Triage Note (Signed)
Emergency Medicine Provider Triage Evaluation Note  Mary Atkins , a 31 y.o. female  was evaluated in triage.  Pt complains of palpitations intermittently for the past 4 days.  States it comes and goes, occasionally makes her cough.  She does not have any chest pain with this.  No shortness of breath.  Denies any known cardiac history or known thyroid disease.  Review of Systems  Positive: palpitations Negative: Fever, chest pain  Physical Exam  BP 132/77    Pulse 80    Temp 98.4 F (36.9 C) (Oral)    Resp 17    SpO2 100%  Gen:   Awake, no distress   Resp:  Normal effort  MSK:   Moves extremities without difficulty  Other:    Medical Decision Making  Medically screening exam initiated at 12:33 AM.  Appropriate orders placed.  Jniya Kilgo was informed that the remainder of the evaluation will be completed by another provider, this initial triage assessment does not replace that evaluation, and the importance of remaining in the ED until their evaluation is complete.  Palpitations. No known cardiac history.  VSS.  EKG, labs including TSH, CXR.   Larene Pickett, PA-C 07/08/21 [phone]

## 2022-04-21 ENCOUNTER — Emergency Department (HOSPITAL_COMMUNITY)
Admission: EM | Admit: 2022-04-21 | Discharge: 2022-04-22 | Disposition: A | Discharge status: Home / Self Care | Payer: Self-pay | Attending: Emergency Medicine | Admitting: Emergency Medicine

## 2022-04-21 DIAGNOSIS — N939 Abnormal uterine and vaginal bleeding, unspecified: Secondary | ICD-10-CM | POA: Insufficient documentation

## 2022-04-22 ENCOUNTER — Emergency Department (HOSPITAL_COMMUNITY): Payer: Self-pay

## 2022-04-22 ENCOUNTER — Other Ambulatory Visit: Payer: Self-pay

## 2022-04-22 ENCOUNTER — Encounter (HOSPITAL_COMMUNITY): Payer: Self-pay

## 2022-04-22 LAB — CBC WITH DIFFERENTIAL/PLATELET
Abs Immature Granulocytes: 0.01 10*3/uL (ref 0.00–0.07)
Basophils Absolute: 0 10*3/uL (ref 0.0–0.1)
Basophils Relative: 1 %
Eosinophils Absolute: 0 10*3/uL (ref 0.0–0.5)
Eosinophils Relative: 0 %
HCT: 38.9 % (ref 36.0–46.0)
Hemoglobin: 12.4 g/dL (ref 12.0–15.0)
Immature Granulocytes: 0 %
Lymphocytes Relative: 30 %
Lymphs Abs: 1.6 10*3/uL (ref 0.7–4.0)
MCH: 26.3 pg (ref 26.0–34.0)
MCHC: 31.9 g/dL (ref 30.0–36.0)
MCV: 82.4 fL (ref 80.0–100.0)
Monocytes Absolute: 0.4 10*3/uL (ref 0.1–1.0)
Monocytes Relative: 7 %
Neutro Abs: 3.3 10*3/uL (ref 1.7–7.7)
Neutrophils Relative %: 62 %
Platelets: 306 10*3/uL (ref 150–400)
RBC: 4.72 MIL/uL (ref 3.87–5.11)
RDW: 14 % (ref 11.5–15.5)
WBC: 5.4 10*3/uL (ref 4.0–10.5)
nRBC: 0 % (ref 0.0–0.2)

## 2022-04-22 LAB — URINALYSIS, ROUTINE W REFLEX MICROSCOPIC
Bacteria, UA: NONE SEEN
Bilirubin Urine: NEGATIVE
Glucose, UA: NEGATIVE mg/dL
Ketones, ur: NEGATIVE mg/dL
Leukocytes,Ua: NEGATIVE
Nitrite: NEGATIVE
Protein, ur: 30 mg/dL — AB
Specific Gravity, Urine: 1.026 (ref 1.005–1.030)
pH: 5 (ref 5.0–8.0)

## 2022-04-22 LAB — LIPASE, BLOOD: Lipase: 29 U/L (ref 11–51)

## 2022-04-22 LAB — COMPREHENSIVE METABOLIC PANEL
ALT: 12 U/L (ref 0–44)
AST: 14 U/L — ABNORMAL LOW (ref 15–41)
Albumin: 3.5 g/dL (ref 3.5–5.0)
Alkaline Phosphatase: 56 U/L (ref 38–126)
Anion gap: 10 (ref 5–15)
BUN: 12 mg/dL (ref 6–20)
CO2: 24 mmol/L (ref 22–32)
Calcium: 8.8 mg/dL — ABNORMAL LOW (ref 8.9–10.3)
Chloride: 104 mmol/L (ref 98–111)
Creatinine, Ser: 0.92 mg/dL (ref 0.44–1.00)
GFR, Estimated: >60 mL/min (ref >60)
Glucose, Bld: 93 mg/dL (ref 70–99)
Potassium: 3.7 mmol/L (ref 3.5–5.1)
Sodium: 138 mmol/L (ref 135–145)
Total Bilirubin: 0.4 mg/dL (ref 0.3–1.2)
Total Protein: 7.2 g/dL (ref 6.5–8.1)

## 2022-04-22 LAB — I-STAT BETA HCG BLOOD, ED (MC, WL, AP ONLY): I-stat hCG, quantitative: <5 m[IU]/mL (ref <5)

## 2022-04-22 MED ORDER — ALUM & MAG HYDROXIDE-SIMETH 200-200-20 MG/5ML PO SUSP
30.0000 mL | Freq: Once | ORAL | Status: AC
  Administered 2022-04-22: 30 mL via ORAL
  Filled 2022-04-22: qty 30

## 2022-04-22 MED ORDER — KETOROLAC TROMETHAMINE 30 MG/ML IJ SOLN
30.0000 mg | Freq: Once | INTRAMUSCULAR | Status: DC
  Filled 2022-04-22: qty 1

## 2022-04-22 NOTE — ED Triage Notes (Signed)
Ate some wings and a calzone from [place] 7 days ago and has had abdominal discomfort ever since.   Also sts vaginal bleeding that began today.

## 2022-04-22 NOTE — ED Provider Notes (Signed)
[facility], [place] EMERGENCY DEPARTMENT Provider Note   CSN: [id number] Arrival date & time: 04/21/22  2342     History  Chief Complaint  Patient presents with   Abdominal Pain    Mary Atkins is a 31 y.o. female.  The history is provided by the patient.  Abdominal Pain Pain location:  Generalized Pain quality comment:  Discomfort since eating a calzone from [employer] Pain radiates to:  Does not radiate Pain severity:  Moderate Onset quality:  Sudden Duration:  1 week Timing:  Constant Progression:  Unchanged Chronicity:  New Context: not alcohol use   Relieved by:  Nothing Worsened by:  Nothing Ineffective treatments:  None tried Associated symptoms: vaginal bleeding   Associated symptoms: no anorexia and no fever   Risk factors: no alcohol abuse        Home Medications Prior to Admission medications   Medication Sig Start Date End Date Taking? Authorizing Provider  cyclobenzaprine (FLEXERIL) 10 MG tablet Take 1 tablet (10 mg total) by mouth 2 (two) times daily as needed for muscle spasms. 08/14/16   Roxy Horseman, PA-C  famotidine (PEPCID) 20 MG tablet Take 1 tablet (20 mg total) by mouth 2 (two) times daily. 09/20/16   Trixie Dredge, PA-C  fexofenadine (ALLEGRA) 60 MG tablet Take 1 tablet (60 mg total) by mouth 2 (two) times daily. 09/20/16   Trixie Dredge, PA-C  fluticasone (FLONASE) 50 MCG/ACT nasal spray Place 2 sprays into both nostrils daily. 09/20/16   Trixie Dredge, PA-C  HYDROcodone-acetaminophen (NORCO/VICODIN) 5-325 MG tablet Take 1 tablet by mouth every 6 (six) hours as needed. 04/12/17   Horton, Mayer Masker, MD  ibuprofen (ADVIL,MOTRIN) 800 MG tablet Take 1 tablet (800 mg total) by mouth 3 (three) times daily. 08/14/16   Roxy Horseman, PA-C  penicillin v potassium (VEETID) 250 MG tablet Take 250 mg by mouth 4 (four) times daily.    [provider]      Allergies    Patient has no known allergies.    Review of Systems   Review of  Systems  Constitutional:  Negative for fever.  HENT:  Negative for facial swelling.   Eyes:  Negative for redness.  Respiratory:  Negative for stridor.   Gastrointestinal:  Positive for abdominal pain. Negative for anorexia.  Genitourinary:  Positive for vaginal bleeding.  Neurological:  Negative for facial asymmetry.  All other systems reviewed and are negative.   Physical Exam Updated Vital Signs BP 119/75   Pulse 77   Temp 98.4 F (36.9 C) (Oral)   Resp 20   Ht 5\' 3"  (1.6 m)   Wt 117.9 kg   LMP 04/07/2022 (Exact Date)   SpO2 100%   BMI 46.06 kg/m  Physical Exam Vitals and nursing note reviewed.  Constitutional:      General: She is not in acute distress.    Appearance: Normal appearance. She is well-developed.  HENT:     Head: Normocephalic and atraumatic.     Nose: Nose normal.  Eyes:     Pupils: Pupils are equal, round, and reactive to light.  Cardiovascular:     Rate and Rhythm: Normal rate and regular rhythm.     Pulses: Normal pulses.     Heart sounds: Normal heart sounds.  Pulmonary:     Effort: Pulmonary effort is normal. No respiratory distress.     Breath sounds: Normal breath sounds.  Abdominal:     General: Bowel sounds are normal. There is no distension.  Palpations: Abdomen is soft.     Tenderness: There is no abdominal tenderness. There is no guarding or rebound.  Genitourinary:    Vagina: No vaginal discharge.  Musculoskeletal:        General: Normal range of motion.     Cervical back: Neck supple.  Skin:    General: Skin is warm and dry.     Capillary Refill: Capillary refill takes less than 2 seconds.     Findings: No erythema or rash.  Neurological:     General: No focal deficit present.     Mental Status: She is alert and oriented to person, place, and time.     Deep Tendon Reflexes: Reflexes normal.  Psychiatric:        Mood and Affect: Mood normal.        Behavior: Behavior normal.     ED Results / Procedures / Treatments    Labs (all labs ordered are listed, but only abnormal results are displayed) Results for orders placed or performed during the hospital encounter of 04/21/22  Lipase, blood  Result Value Ref Range   Lipase 29 11 - 51 U/L  Comprehensive metabolic panel  Result Value Ref Range   Sodium 138 135 - 145 mmol/L   Potassium 3.7 3.5 - 5.1 mmol/L   Chloride 104 98 - 111 mmol/L   CO2 24 22 - 32 mmol/L   Glucose, Bld 93 70 - 99 mg/dL   BUN 12 6 - 20 mg/dL   Creatinine, Ser 8.65 0.44 - 1.00 mg/dL   Calcium 8.8 (L) 8.9 - 10.3 mg/dL   Total Protein 7.2 6.5 - 8.1 g/dL   Albumin 3.5 3.5 - 5.0 g/dL   AST 14 (L) 15 - 41 U/L   ALT 12 0 - 44 U/L   Alkaline Phosphatase 56 38 - 126 U/L   Total Bilirubin 0.4 0.3 - 1.2 mg/dL   GFR, Estimated >78 >46 mL/min   Anion gap 10 5 - 15  Urinalysis, Routine w reflex microscopic  Result Value Ref Range   Color, Urine YELLOW YELLOW   APPearance HAZY (A) CLEAR   Specific Gravity, Urine 1.026 1.005 - 1.030   pH 5.0 5.0 - 8.0   Glucose, UA NEGATIVE NEGATIVE mg/dL   Hgb urine dipstick LARGE (A) NEGATIVE   Bilirubin Urine NEGATIVE NEGATIVE   Ketones, ur NEGATIVE NEGATIVE mg/dL   Protein, ur 30 (A) NEGATIVE mg/dL   Nitrite NEGATIVE NEGATIVE   Leukocytes,Ua NEGATIVE NEGATIVE   RBC / HPF 21-50 0 - 5 RBC/hpf   WBC, UA 0-5 0 - 5 WBC/hpf   Bacteria, UA NONE SEEN NONE SEEN   Squamous Epithelial / LPF 0-5 0 - 5   Mucus PRESENT    Hyaline Casts, UA PRESENT   CBC with Differential  Result Value Ref Range   WBC 5.4 4.0 - 10.5 K/uL   RBC 4.72 3.87 - 5.11 MIL/uL   Hemoglobin 12.4 12.0 - 15.0 g/dL   HCT 96.2 95.2 - 84.1 %   MCV 82.4 80.0 - 100.0 fL   MCH 26.3 26.0 - 34.0 pg   MCHC 31.9 30.0 - 36.0 g/dL   RDW 32.4 40.1 - 02.7 %   Platelets 306 150 - 400 K/uL   nRBC 0.0 0.0 - 0.2 %   Neutrophils Relative % 62 %   Neutro Abs 3.3 1.7 - 7.7 K/uL   Lymphocytes Relative 30 %   Lymphs Abs 1.6 0.7 - 4.0 K/uL   Monocytes Relative 7 %  Monocytes Absolute 0.4 0.1 - 1.0  K/uL   Eosinophils Relative 0 %   Eosinophils Absolute 0.0 0.0 - 0.5 K/uL   Basophils Relative 1 %   Basophils Absolute 0.0 0.0 - 0.1 K/uL   Immature Granulocytes 0 %   Abs Immature Granulocytes 0.01 0.00 - 0.07 K/uL  I-Stat beta hCG blood, ED  Result Value Ref Range   I-stat hCG, quantitative <5.0 <5 mIU/mL   Comment 3           CT Renal Stone Study  Result Date: 04/22/2022 CLINICAL DATA:  Flank pain, kidney stone suspected. Vaginal bleeding. EXAM: CT ABDOMEN AND PELVIS WITHOUT CONTRAST TECHNIQUE: Multidetector CT imaging of the abdomen and pelvis was performed following the standard protocol without IV contrast. RADIATION DOSE REDUCTION: This exam was performed according to the departmental dose-optimization program which includes automated exposure control, adjustment of the mA and/or kV according to patient size and/or use of iterative reconstruction technique. COMPARISON:  None Available. FINDINGS: Lower chest: No acute abnormality. Hepatobiliary: No focal liver abnormality is seen. No gallstones, gallbladder wall thickening, or biliary dilatation. Pancreas: Unremarkable Spleen: Unremarkable Adrenals/Urinary Tract: Adrenal glands are unremarkable. Kidneys are normal, without renal calculi, focal lesion, or hydronephrosis. Bladder is unremarkable. Stomach/Bowel: Stomach is within normal limits. Appendix appears normal. No evidence of bowel wall thickening, distention, or inflammatory changes. Vascular/Lymphatic: No significant vascular findings are present. No enlarged abdominal or pelvic lymph nodes. Reproductive: Uterus and bilateral adnexa are unremarkable. Other: No abdominal wall hernia or abnormality. No abdominopelvic ascites. Musculoskeletal: No acute or significant osseous findings. IMPRESSION: 1. No acute intra-abdominal pathology identified. No definite radiographic explanation for the patient's reported symptoms. Electronically Signed   By: Fidela Salisbury M.D.   On: 04/22/2022 02:23      EKG None  Radiology CT Renal Stone Study  Result Date: 04/22/2022 CLINICAL DATA:  Flank pain, kidney stone suspected. Vaginal bleeding. EXAM: CT ABDOMEN AND PELVIS WITHOUT CONTRAST TECHNIQUE: Multidetector CT imaging of the abdomen and pelvis was performed following the standard protocol without IV contrast. RADIATION DOSE REDUCTION: This exam was performed according to the departmental dose-optimization program which includes automated exposure control, adjustment of the mA and/or kV according to patient size and/or use of iterative reconstruction technique. COMPARISON:  None Available. FINDINGS: Lower chest: No acute abnormality. Hepatobiliary: No focal liver abnormality is seen. No gallstones, gallbladder wall thickening, or biliary dilatation. Pancreas: Unremarkable Spleen: Unremarkable Adrenals/Urinary Tract: Adrenal glands are unremarkable. Kidneys are normal, without renal calculi, focal lesion, or hydronephrosis. Bladder is unremarkable. Stomach/Bowel: Stomach is within normal limits. Appendix appears normal. No evidence of bowel wall thickening, distention, or inflammatory changes. Vascular/Lymphatic: No significant vascular findings are present. No enlarged abdominal or pelvic lymph nodes. Reproductive: Uterus and bilateral adnexa are unremarkable. Other: No abdominal wall hernia or abnormality. No abdominopelvic ascites. Musculoskeletal: No acute or significant osseous findings. IMPRESSION: 1. No acute intra-abdominal pathology identified. No definite radiographic explanation for the patient's reported symptoms. Electronically Signed   By: Fidela Salisbury M.D.   On: 04/22/2022 02:23    Procedures Procedures    Medications Ordered in ED Medications  ketorolac (TORADOL) 30 MG/ML injection 30 mg (30 mg Intravenous Not Given 04/22/22 0156)    ED Course/ Medical Decision Making/ A&P                           Medical Decision Making Diffuse abdominal pain since eating a calzone at [employer] and has had  diffuse pain   Amount and/or Complexity of Data Reviewed External Data Reviewed: notes.    Details: Previous notes reviewed  Labs: ordered.    Details: All labs reviewed:  urine has blood consistent with period.  Lipase normal at 29.  Sodium normal 138, normal potassium 3.7, normal creatinine .92, normal LFTs.  Normal white count 5.4, normal hemoglobin 12.4 and platelet count  Radiology: ordered and independent interpretation performed.    Details: CT normal by my read  Risk Prescription drug management. Risk Details: Exam, labs and imaging are benign and reassuring.  Eat a bland diet x 7 days and follow up with your PMD.  Strict return precautions.      Final Clinical Impression(s) / ED Diagnoses Final diagnoses:  Vaginal bleeding    Return for intractable cough, coughing up blood, fevers > 100.4 unrelieved by medication, shortness of breath, intractable vomiting, chest pain, shortness of breath, weakness, numbness, changes in speech, facial asymmetry, abdominal pain, passing out, Inability to tolerate liquids or food, cough, altered mental status or any concerns. No signs of systemic illness or infection. The patient is nontoxic-appearing on exam and vital signs are within normal limits.  I have reviewed the triage vital signs and the nursing notes. Pertinent labs & imaging results that were available during my care of the patient were reviewed by me and considered in my medical decision making (see chart for details). After history, exam, and medical workup I feel the patient has been appropriately medically screened and is safe for discharge home. Pertinent diagnoses were discussed with the patient. Patient was given return precautions.    Rx / DC Orders ED Discharge Orders     None         Gaye Scorza, MD 04/22/22 0569

## 2022-08-07 IMAGING — CR DG CHEST 2V
2 series · 2 of 2 positions shown · non-contrast
Comparison: 09/20/2016

CLINICAL DATA: Cardiac palpitations

EXAM:
CHEST - 2 VIEW

[chest pa]
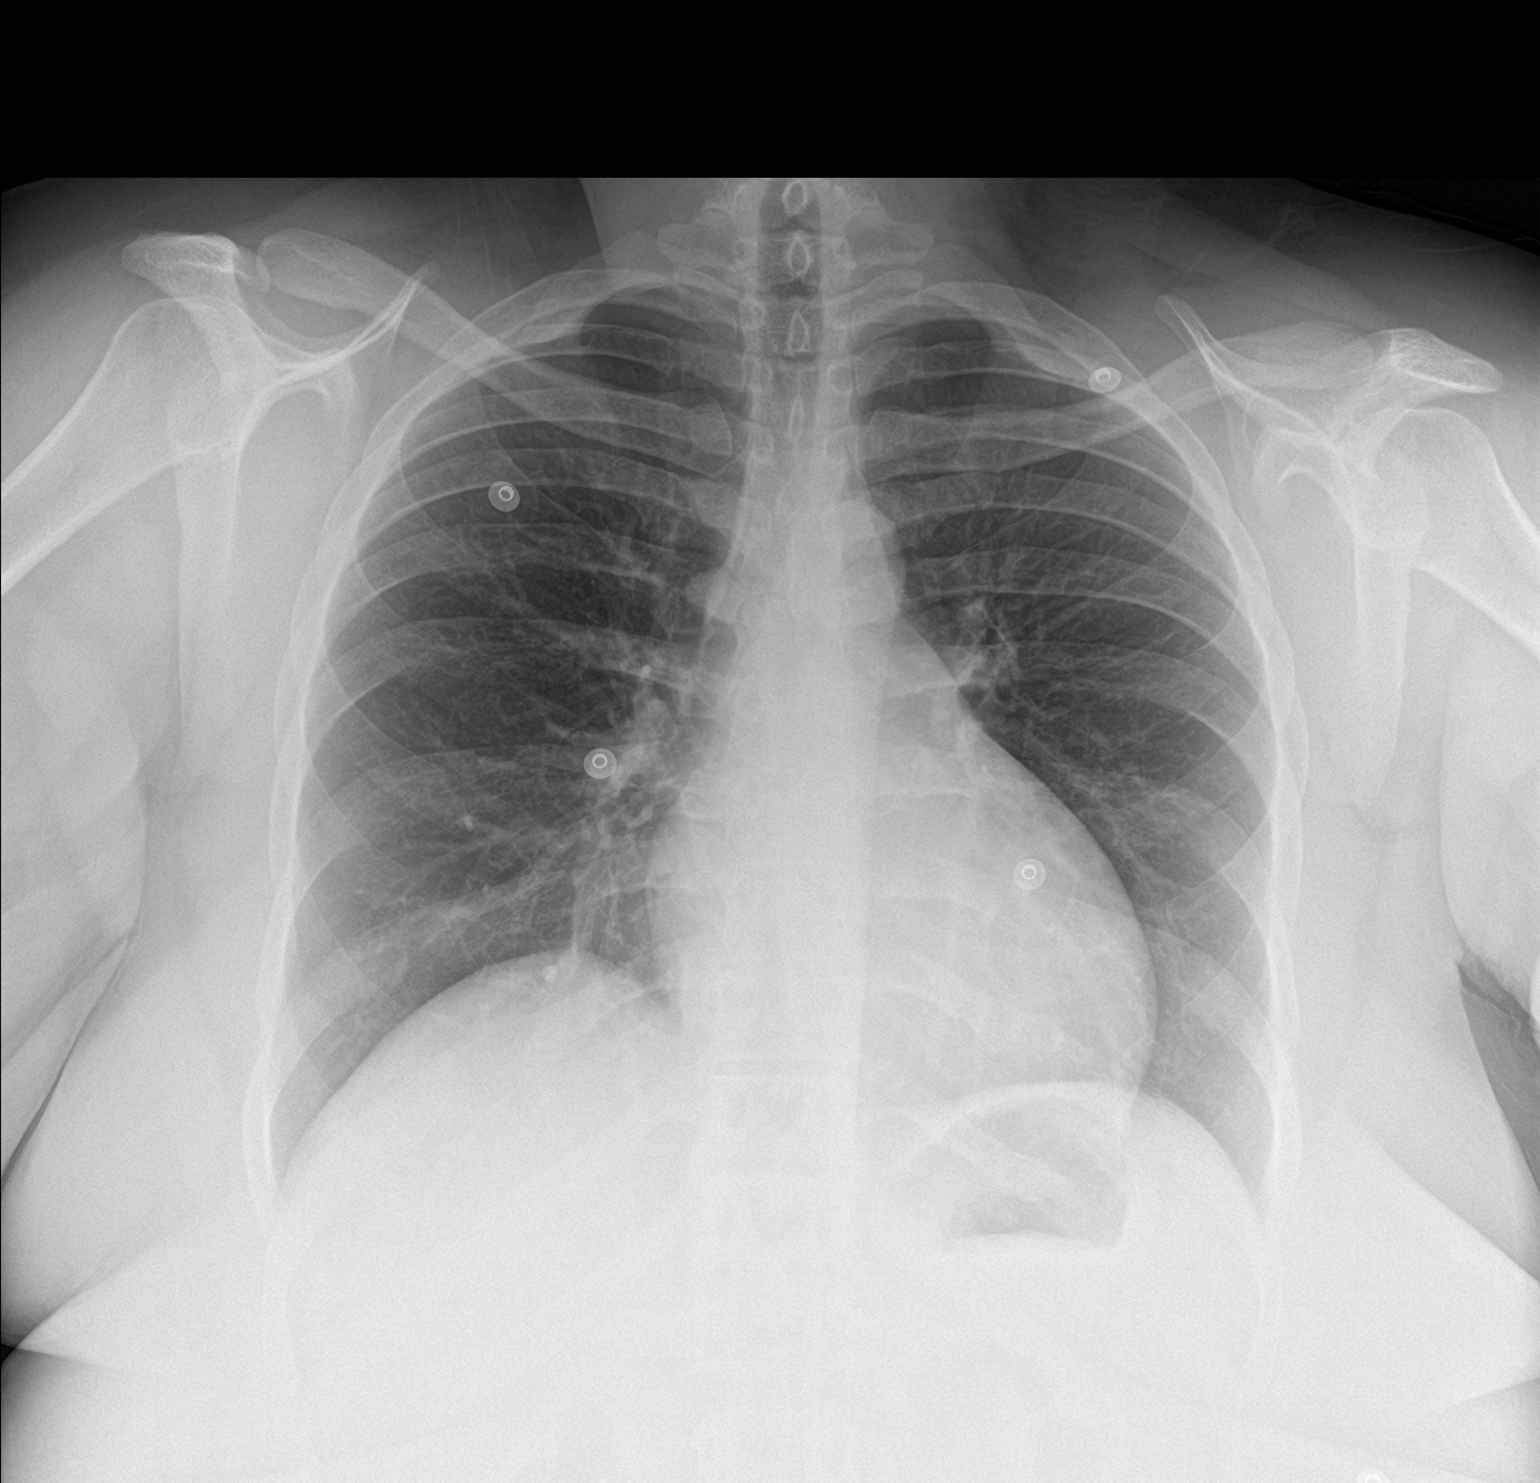

[chest lat]
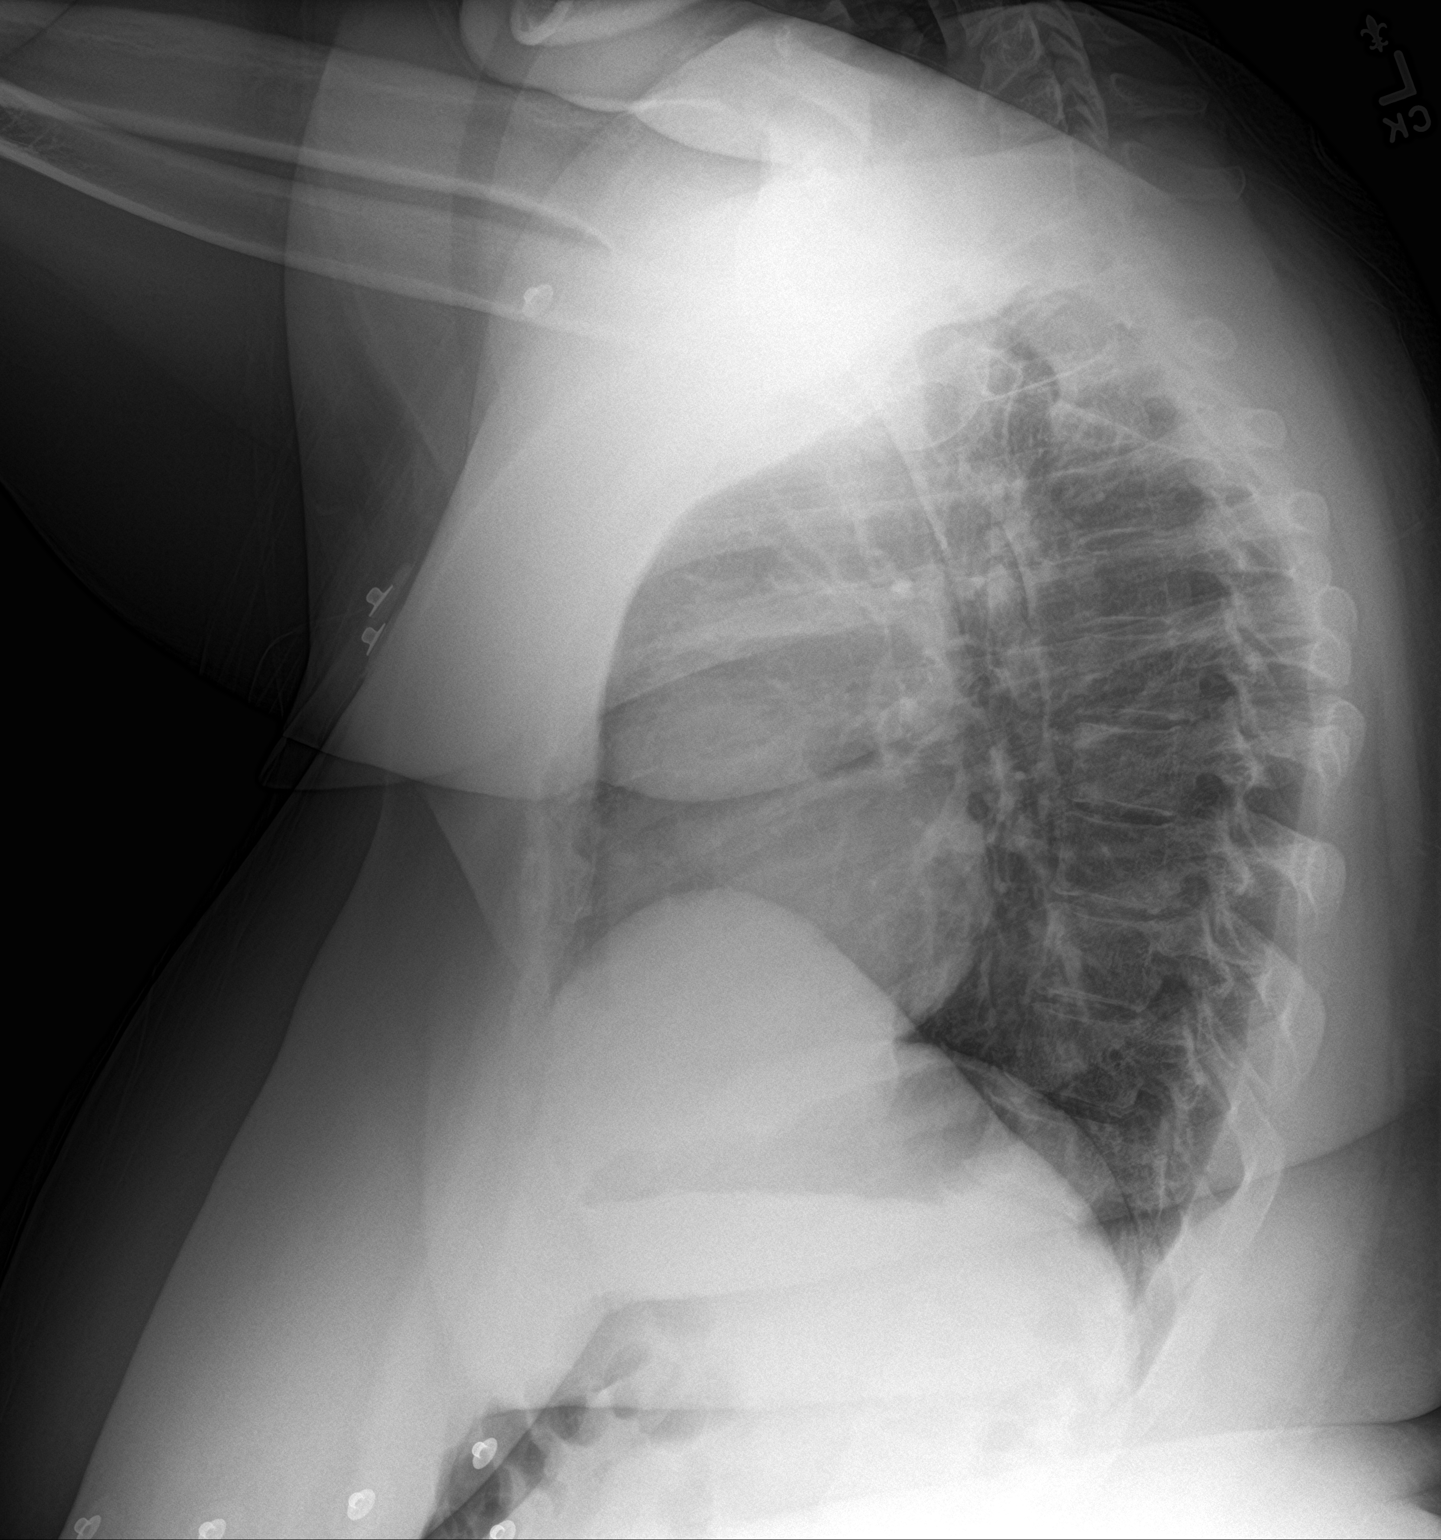

[2 of 2 positions shown; findings below may reference images not displayed]

FINDINGS: The heart size and mediastinal contours are within normal limits.
Both lungs are clear. The visualized skeletal structures are
unremarkable.
IMPRESSION: No active cardiopulmonary disease.
